# Patient Record
Sex: Female | Born: 1965
Health system: Southern US, Community
[De-identification: ages and names within clinical notes are randomized; demographics above are authoritative.]

## PROBLEM LIST (undated history)

## (undated) ENCOUNTER — Emergency Department (HOSPITAL_COMMUNITY): Admission: EM | Discharge status: Absent without leave | Payer: Self-pay

## (undated) DIAGNOSIS — G8929 Other chronic pain: Secondary | ICD-10-CM

## (undated) DIAGNOSIS — F32A Depression, unspecified: Secondary | ICD-10-CM

## (undated) DIAGNOSIS — F329 Major depressive disorder, single episode, unspecified: Secondary | ICD-10-CM

## (undated) DIAGNOSIS — I10 Essential (primary) hypertension: Secondary | ICD-10-CM

## (undated) DIAGNOSIS — R569 Unspecified convulsions: Secondary | ICD-10-CM

## (undated) DIAGNOSIS — R51 Headache: Secondary | ICD-10-CM

## (undated) DIAGNOSIS — R519 Headache, unspecified: Secondary | ICD-10-CM

## (undated) HISTORY — DX: Unspecified convulsions: R56.9

## (undated) HISTORY — DX: Depression, unspecified: F32.A

## (undated) HISTORY — DX: Other chronic pain: G89.29

## (undated) HISTORY — PX: TUBAL LIGATION: SHX77

## (undated) HISTORY — DX: Essential (primary) hypertension: I10

## (undated) HISTORY — DX: Major depressive disorder, single episode, unspecified: F32.9

## (undated) HISTORY — DX: Headache, unspecified: R51.9

## (undated) HISTORY — DX: Headache: R51

---

## 1998-07-13 ENCOUNTER — Emergency Department (HOSPITAL_COMMUNITY): Admission: EM | Admit: 1998-07-13 | Discharge: 1998-07-13 | Payer: Self-pay | Admitting: Emergency Medicine

## 1998-07-15 ENCOUNTER — Emergency Department (HOSPITAL_COMMUNITY): Admission: EM | Admit: 1998-07-15 | Discharge: 1998-07-15 | Payer: Self-pay | Admitting: Endocrinology

## 1998-07-17 ENCOUNTER — Emergency Department (HOSPITAL_COMMUNITY): Admission: EM | Admit: 1998-07-17 | Discharge: 1998-07-17 | Payer: Self-pay | Admitting: Emergency Medicine

## 2001-04-08 ENCOUNTER — Encounter: Payer: Self-pay | Admitting: *Deleted

## 2001-04-08 ENCOUNTER — Emergency Department (HOSPITAL_COMMUNITY): Admission: AC | Admit: 2001-04-08 | Discharge: 2001-04-08 | Payer: Self-pay

## 2001-04-08 ENCOUNTER — Encounter: Payer: Self-pay | Admitting: Emergency Medicine

## 2001-04-08 ENCOUNTER — Encounter: Payer: Self-pay | Admitting: Orthopedic Surgery

## 2001-04-21 ENCOUNTER — Encounter: Payer: Self-pay | Admitting: Orthopedic Surgery

## 2001-04-23 ENCOUNTER — Ambulatory Visit (HOSPITAL_COMMUNITY): Admission: RE | Admit: 2001-04-23 | Discharge: 2001-04-24 | Payer: Self-pay | Admitting: Orthopedic Surgery

## 2004-05-29 ENCOUNTER — Emergency Department (HOSPITAL_COMMUNITY): Admission: EM | Admit: 2004-05-29 | Discharge: 2004-05-29 | Payer: Self-pay | Admitting: Emergency Medicine

## 2004-07-04 ENCOUNTER — Emergency Department (HOSPITAL_COMMUNITY): Admission: EM | Admit: 2004-07-04 | Discharge: 2004-07-04 | Payer: Self-pay | Admitting: Emergency Medicine

## 2004-09-20 ENCOUNTER — Emergency Department (HOSPITAL_COMMUNITY): Admission: EM | Admit: 2004-09-20 | Discharge: 2004-09-20 | Payer: Self-pay | Admitting: Emergency Medicine

## 2004-12-11 ENCOUNTER — Emergency Department (HOSPITAL_COMMUNITY): Admission: EM | Admit: 2004-12-11 | Discharge: 2004-12-11 | Payer: Self-pay | Admitting: Emergency Medicine

## 2005-01-26 ENCOUNTER — Emergency Department (HOSPITAL_COMMUNITY): Admission: EM | Admit: 2005-01-26 | Discharge: 2005-01-26 | Payer: Self-pay | Admitting: Emergency Medicine

## 2005-02-28 ENCOUNTER — Ambulatory Visit: Payer: Self-pay | Admitting: Internal Medicine

## 2005-03-09 ENCOUNTER — Ambulatory Visit (HOSPITAL_COMMUNITY): Admission: RE | Admit: 2005-03-09 | Discharge: 2005-03-09 | Payer: Self-pay | Admitting: *Deleted

## 2005-06-24 ENCOUNTER — Emergency Department (HOSPITAL_COMMUNITY): Admission: EM | Admit: 2005-06-24 | Discharge: 2005-06-24 | Payer: Self-pay | Admitting: Emergency Medicine

## 2005-11-24 ENCOUNTER — Emergency Department (HOSPITAL_COMMUNITY): Admission: EM | Admit: 2005-11-24 | Discharge: 2005-11-24 | Payer: Self-pay | Admitting: Family Medicine

## 2005-11-27 ENCOUNTER — Encounter: Admission: RE | Admit: 2005-11-27 | Discharge: 2005-11-27 | Payer: Self-pay | Admitting: Occupational Medicine

## 2005-12-12 ENCOUNTER — Encounter: Admission: RE | Admit: 2005-12-12 | Discharge: 2006-03-12 | Payer: Self-pay | Admitting: Occupational Medicine

## 2006-01-31 ENCOUNTER — Encounter: Admission: RE | Admit: 2006-01-31 | Discharge: 2006-05-01 | Payer: Self-pay | Admitting: Anesthesiology

## 2006-02-04 ENCOUNTER — Ambulatory Visit: Payer: Self-pay | Admitting: Anesthesiology

## 2006-02-19 ENCOUNTER — Encounter
Admission: RE | Admit: 2006-02-19 | Discharge: 2006-05-20 | Payer: Self-pay | Admitting: Physical Medicine & Rehabilitation

## 2006-02-20 ENCOUNTER — Ambulatory Visit: Payer: Self-pay | Admitting: Physical Medicine & Rehabilitation

## 2006-03-26 ENCOUNTER — Encounter: Admission: RE | Admit: 2006-03-26 | Discharge: 2006-06-24 | Payer: Self-pay | Admitting: Occupational Medicine

## 2006-04-02 ENCOUNTER — Telehealth (INDEPENDENT_AMBULATORY_CARE_PROVIDER_SITE_OTHER): Payer: Self-pay | Admitting: *Deleted

## 2006-04-02 DIAGNOSIS — R569 Unspecified convulsions: Secondary | ICD-10-CM

## 2006-06-06 ENCOUNTER — Emergency Department (HOSPITAL_COMMUNITY): Admission: EM | Admit: 2006-06-06 | Discharge: 2006-06-06 | Payer: Self-pay | Admitting: Emergency Medicine

## 2006-10-07 ENCOUNTER — Emergency Department (HOSPITAL_COMMUNITY): Admission: EM | Admit: 2006-10-07 | Discharge: 2006-10-07 | Payer: Self-pay | Admitting: Emergency Medicine

## 2006-11-27 ENCOUNTER — Emergency Department (HOSPITAL_COMMUNITY): Admission: EM | Admit: 2006-11-27 | Discharge: 2006-11-27 | Payer: Self-pay | Admitting: Emergency Medicine

## 2007-01-27 ENCOUNTER — Emergency Department (HOSPITAL_COMMUNITY): Admission: EM | Admit: 2007-01-27 | Discharge: 2007-01-27 | Payer: Self-pay | Admitting: Emergency Medicine

## 2007-02-24 ENCOUNTER — Encounter (INDEPENDENT_AMBULATORY_CARE_PROVIDER_SITE_OTHER): Payer: Self-pay | Admitting: Nurse Practitioner

## 2007-02-24 ENCOUNTER — Ambulatory Visit: Payer: Self-pay | Admitting: Family Medicine

## 2007-02-24 LAB — CONVERTED CEMR LAB
ALT: 25 units/L (ref 0–35)
AST: 25 units/L (ref 0–37)
Alkaline Phosphatase: 95 units/L (ref 39–117)
Basophils Absolute: 0 10*3/uL (ref 0.0–0.1)
Eosinophils Absolute: 0 10*3/uL (ref 0.0–0.7)
Eosinophils Relative: 1 % (ref 0–5)
HCT: 40.6 % (ref 36.0–46.0)
LDL Cholesterol: 123 mg/dL — ABNORMAL HIGH (ref 0–99)
MCV: 95.8 fL (ref 78.0–100.0)
Neutrophils Relative %: 62 % (ref 43–77)
Phenytoin Lvl: 3.7 ug/mL — ABNORMAL LOW (ref 10.0–20.0)
Platelets: 375 10*3/uL (ref 150–400)
Potassium: 4.6 meq/L (ref 3.5–5.3)
RDW: 15.5 % (ref 11.5–15.5)
Sodium: 141 meq/L (ref 135–145)
TSH: 1.31 microintl units/mL (ref 0.350–5.50)
Total Bilirubin: 0.3 mg/dL (ref 0.3–1.2)
Total Protein: 8.1 g/dL (ref 6.0–8.3)
VLDL: 24 mg/dL (ref 0–40)

## 2007-07-27 ENCOUNTER — Ambulatory Visit: Payer: Self-pay | Admitting: *Deleted

## 2007-09-11 ENCOUNTER — Emergency Department (HOSPITAL_COMMUNITY): Admission: AC | Admit: 2007-09-11 | Discharge: 2007-09-11 | Payer: Self-pay

## 2007-11-04 ENCOUNTER — Ambulatory Visit: Payer: Self-pay | Admitting: Internal Medicine

## 2007-11-04 LAB — CONVERTED CEMR LAB: Phenytoin Lvl: <0.5 ug/mL — ABNORMAL LOW (ref 10.0–20.0)

## 2007-12-04 ENCOUNTER — Emergency Department (HOSPITAL_COMMUNITY): Admission: EM | Admit: 2007-12-04 | Discharge: 2007-12-04 | Payer: Self-pay | Admitting: Emergency Medicine

## 2007-12-20 ENCOUNTER — Emergency Department (HOSPITAL_COMMUNITY): Admission: EM | Admit: 2007-12-20 | Discharge: 2007-12-20 | Payer: Self-pay | Admitting: Emergency Medicine

## 2007-12-22 ENCOUNTER — Ambulatory Visit: Payer: Self-pay | Admitting: Family Medicine

## 2007-12-22 ENCOUNTER — Encounter (INDEPENDENT_AMBULATORY_CARE_PROVIDER_SITE_OTHER): Payer: Self-pay | Admitting: Family Medicine

## 2008-04-01 ENCOUNTER — Ambulatory Visit: Payer: Self-pay | Admitting: Family Medicine

## 2008-04-01 LAB — CONVERTED CEMR LAB
AST: 14 units/L (ref 0–37)
Albumin: 4.2 g/dL (ref 3.5–5.2)
Alkaline Phosphatase: 141 units/L — ABNORMAL HIGH (ref 39–117)
BUN: 7 mg/dL (ref 6–23)
Basophils Relative: 0 % (ref 0–1)
Creatinine, Ser: 0.66 mg/dL (ref 0.40–1.20)
Eosinophils Absolute: 0 10*3/uL (ref 0.0–0.7)
MCHC: 29 g/dL — ABNORMAL LOW (ref 30.0–36.0)
MCV: 90.7 fL (ref 78.0–100.0)
Monocytes Relative: 9 % (ref 3–12)
Neutrophils Relative %: 64 % (ref 43–77)
Platelets: 347 10*3/uL (ref 150–400)
Potassium: 4 meq/L (ref 3.5–5.3)
RBC: 3.96 M/uL (ref 3.87–5.11)
RDW: 16.6 % — ABNORMAL HIGH (ref 11.5–15.5)

## 2008-07-21 ENCOUNTER — Ambulatory Visit: Payer: Self-pay | Admitting: Internal Medicine

## 2008-08-25 ENCOUNTER — Ambulatory Visit: Payer: Self-pay | Admitting: Family Medicine

## 2008-11-29 ENCOUNTER — Ambulatory Visit: Payer: Self-pay | Admitting: Family Medicine

## 2008-11-29 LAB — CONVERTED CEMR LAB
ALT: 17 units/L (ref 0–35)
AST: 19 units/L (ref 0–37)
Bilirubin, Direct: 0.1 mg/dL (ref 0.0–0.3)
Indirect Bilirubin: 0.1 mg/dL (ref 0.0–0.9)

## 2008-12-23 ENCOUNTER — Ambulatory Visit: Payer: Self-pay | Admitting: Family Medicine

## 2008-12-23 LAB — CONVERTED CEMR LAB
Basophils Absolute: 0 10*3/uL (ref 0.0–0.1)
Basophils Relative: 1 % (ref 0–1)
Eosinophils Relative: 1 % (ref 0–5)
HCT: 39.2 % (ref 36.0–46.0)
Hemoglobin: 12.4 g/dL (ref 12.0–15.0)
MCHC: 31.6 g/dL (ref 30.0–36.0)
MCV: 94.9 fL (ref 78.0–100.0)
Monocytes Absolute: 0.4 10*3/uL (ref 0.1–1.0)
RDW: 14.2 % (ref 11.5–15.5)

## 2008-12-26 ENCOUNTER — Ambulatory Visit (HOSPITAL_COMMUNITY): Admission: RE | Admit: 2008-12-26 | Discharge: 2008-12-26 | Payer: Self-pay | Admitting: Family Medicine

## 2009-01-19 ENCOUNTER — Ambulatory Visit: Payer: Self-pay | Admitting: Internal Medicine

## 2009-01-31 ENCOUNTER — Ambulatory Visit (HOSPITAL_COMMUNITY): Admission: RE | Admit: 2009-01-31 | Discharge: 2009-01-31 | Payer: Self-pay | Admitting: Family Medicine

## 2009-03-24 ENCOUNTER — Ambulatory Visit: Payer: Self-pay | Admitting: Family Medicine

## 2009-08-07 ENCOUNTER — Ambulatory Visit: Payer: Self-pay | Admitting: Family Medicine

## 2009-08-10 ENCOUNTER — Ambulatory Visit: Payer: Self-pay | Admitting: Internal Medicine

## 2010-03-11 ENCOUNTER — Encounter: Payer: Self-pay | Admitting: *Deleted

## 2010-05-06 ENCOUNTER — Emergency Department (HOSPITAL_COMMUNITY): Payer: Worker's Compensation

## 2010-05-06 ENCOUNTER — Emergency Department (HOSPITAL_COMMUNITY)
Admission: EM | Admit: 2010-05-06 | Discharge: 2010-05-06 | Disposition: A | Discharge status: Home / Self Care | Payer: Worker's Compensation | Attending: Emergency Medicine | Admitting: Emergency Medicine

## 2010-05-06 DIAGNOSIS — G40909 Epilepsy, unspecified, not intractable, without status epilepticus: Secondary | ICD-10-CM | POA: Insufficient documentation

## 2010-05-06 DIAGNOSIS — X500XXA Overexertion from strenuous movement or load, initial encounter: Secondary | ICD-10-CM | POA: Insufficient documentation

## 2010-05-06 DIAGNOSIS — IMO0002 Reserved for concepts with insufficient information to code with codable children: Secondary | ICD-10-CM | POA: Insufficient documentation

## 2010-05-06 DIAGNOSIS — M25519 Pain in unspecified shoulder: Secondary | ICD-10-CM | POA: Insufficient documentation

## 2010-05-06 DIAGNOSIS — R209 Unspecified disturbances of skin sensation: Secondary | ICD-10-CM | POA: Insufficient documentation

## 2010-06-20 ENCOUNTER — Emergency Department (HOSPITAL_COMMUNITY)
Admission: EM | Admit: 2010-06-20 | Discharge: 2010-06-20 | Disposition: A | Discharge status: Home / Self Care | Payer: Worker's Compensation | Attending: Emergency Medicine | Admitting: Emergency Medicine

## 2010-06-20 DIAGNOSIS — R609 Edema, unspecified: Secondary | ICD-10-CM | POA: Insufficient documentation

## 2010-06-20 DIAGNOSIS — G40909 Epilepsy, unspecified, not intractable, without status epilepticus: Secondary | ICD-10-CM | POA: Insufficient documentation

## 2010-06-20 DIAGNOSIS — M62838 Other muscle spasm: Secondary | ICD-10-CM | POA: Insufficient documentation

## 2010-06-20 DIAGNOSIS — M79609 Pain in unspecified limb: Secondary | ICD-10-CM | POA: Insufficient documentation
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
## 2010-07-06 NOTE — Procedures (Signed)
NAME:  Kathleen Davis, WIGAL NO.:  [id number]   MEDICAL RECORD NO.:  [id number]          PATIENT TYPE:  REC   LOCATION:  [facility]                         FACILITY:  [facility]   PHYSICIAN:  Erick Colace, M.D.DATE OF BIRTH:  October 22, 1965   DATE OF PROCEDURE:  DATE OF DISCHARGE:                               OPERATIVE REPORT   PROCEDURE:  Right sacroiliac injection under fluoroscopic guidance.   INDICATIONS FOR PROCEDURE:  Sacroiliac disorder, only partially  responsive to medication management under conservative care.  She had a  left sacroiliac injection on February 20, 2006 and now has similar  symptoms on the right side.  She no longer has left-sided pain.   The pain is greater than 6/10.   Informed consent was obtained after describing risks and benefits to the  patient.  These include bleeding, bruising, infection, loss of bowel and  bladder function, temporary or permanent paralysis.  She would like to  proceed and has given written consent.   Patient placed prone on fluoroscopy table, Betadine prep and sterile  drape.  A 25-gauge, 1.5-inch needle was used to anesthetize the skin and  subcutaneous tissue with 1% Lidocaine times 2 cc.  Then a 22-gauge, 3.5-  inch spinal needle was inserted in the right sacroiliac joint under  fluoroscopic guidance.  AP and lateral imaging utilized.  Omnipaque 180  demonstrated no intravascular uptake in a solution containing .5cc of  40mg /cc Depo-Medrol and 1.5 cc of 2% lidocaine NPF.  The patient  tolerated the procedure well.  Pre and postinjection vitals stable.  Preinjection pain level 7/10 and postinjection pain level 0/10.   Patient will follow up with Dr. Alto Denver.  She will assess the further needs  for injection, if necessary.      Erick Colace, M.D.  Electronically Signed     AEK/MEDQ  D:  03/31/2006 11:35:54  T:  03/31/2006 13:03:24  Job:  [id number]   cc:   Zelphia [place]. Alto Denver, M.D.  Fax: [phone]

## 2010-07-06 NOTE — Procedures (Signed)
NAME:  [name], [name] NO.:  [id number]   MEDICAL RECORD NO.:  [id number]          PATIENT TYPE:  REC   LOCATION:  [facility]                          FACILITY:  [facility]   PHYSICIAN:  Celene Kras, MD        DATE OF BIRTH:  02-12-1966   DATE OF PROCEDURE:  02/04/2006  DATE OF DISCHARGE:                               OPERATIVE REPORT   Kathleen Davis comes to [facility] today through referral of  Kathleen Davis, M.D.  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  Since this time she has had escalated pain in the inferogluteal region,  paralumbar pain, decreased endurance and range of motion.  She has been  taking ibuprofen treated conservatively continues to breakthrough. No  advancing neurological features. Modest radicular component.  I reviewed  the MRI which reveals degenerative changes lower segments, no obvious  HNP bulging pattern, facetal overlay.  The pain is 4/10 subjective  scale, escalating with most activities. She is able to perform some of  her activities, difficulty with driving, sitting and moving.  Medications do help, as well as rest.  Medications attached to chart.  She states no specific allergy. 14 point review of systems and health  and history form reviewed.  She is a smoker I caution, she will see her  primary care regarding this.  This is contributory.   PAST MEDICAL HISTORY:  Otherwise essentially denies. States she is a  healthy, she had no particular surgeries.  Remotely she had a seizure,  but this has not been problematic and she is able to drive and the like.   FAMILY HISTORY:  Otherwise noncontributory.   REVIEW OF SYSTEMS/SOCIAL HISTORY:  As to has no otherwise noted and  noncontributory to the pain problem.   PHYSICAL EXAM:  GENERAL:  A pleasant, moderately obese female sitting  comfortably in bed.  Gay affect. Appearance is normal, oriented x3.  HEENT:   Unremarkable.  CHEST:  Clear to auscultation and percussion.  She has a regular rate  and rhythm without rub, murmur, or gallop.  ABDOMEN:  Obese, benign, no hepatosplenomegaly.  Diffuse paralumbar  myofascial discomfort, Wharton test positive with modified gaze using  Patrick's positive left greater than right.  EHL is fine.  Intact  neurologically. Straight leg lift modestly impaired left greater than  right. Side bending positive.  She has no other overt neurological  deficit, motor sensory reflexive.   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   PLAN:  1. It is reasonable to go on to SI joint injection inferior medial      margin and follow expectantly.  If she breaks through I am      wondering if a central compartment injection would be helpful or      possibly a transforaminal injection at 5.  There is a modest      component here.  I do not think the facets are  over playing this      much.  This will be diagnostic and therapeutic positive predictive      experience, at some point may lead [place] to consideration      radiofrequency neural ablation.  She is a forthright individual who      wants to continue to work and be as active as possible.  2. We are also going to give her some Wygesic for breakthrough, as she      is taking quite a bit of ibuprofen.  3. Maintain contact with Dr. Marny Lowenstein.  4. Consider TENS unit muscle stimulator.  5. Weight control and cigarette cessation for best outcome.   She is consented for today's procedure.   DESCRIPTION OF PROCEDURE:  The patient taken to fluoroscopy suite and  placed in the prone position.  Back prepped, draped usual fashion. Using  a 20-gauge spinal needle I advanced the inferior medial margin, left SI  and confirmed placement.  We then injected 3 mL lidocaine 1% MPF and 40  mg of Aristocort.   She tolerated the procedure well.  No complications from procedure.  She   states she was improved at discharge.  No barrier to communication.  Predicate further injection based on need and overall response.           ______________________________  Celene Kras, MD     HH/MEDQ  D:  02/04/2006 13:41:16  T:  02/05/2006 00:13:01  Job:  [id number]   cc:   Kathleen Davis, M.D.  Fax: [phone]
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
## 2010-07-06 NOTE — Procedures (Signed)
NAME:  Kathleen Davis, Kathleen Davis NO.:  [id number]   MEDICAL RECORD NO.:  [id number]          PATIENT TYPE:  REC   LOCATION:  [facility]                          FACILITY:  [facility]   PHYSICIAN:  Erick Colace, M.D.DATE OF BIRTH:  1966/01/18   DATE OF PROCEDURE:  02/20/2006  DATE OF DISCHARGE:                               OPERATIVE REPORT   PROCEDURE:  Left sacroiliac joint injection with fluoroscopic guidance.   INDICATIONS FOR PROCEDURE:  Left low back and buttock pain with good  relief with prior sacroiliac joint on 02/04/06 per Dr. Stevphen Rochester at this  office.  The patient still has some residual pain mainly with activity  where it goes up to 6 out of 10 level.  At rest it is 2 out of 10.   Informed consent was obtained after describing risks and benefits of the  procedure.  These including bleeding, bruising, infection and lower  extremity paralysis.  She elects to proceed and has given written  consent.  She has not taken any anticoagulant medications or antibiotics  at the current time.  She has no drug sensitivities or allergies to  lidocaine or to corticosteroids.   DESCRIPTION OF PROCEDURE:  The patient was placed prone on the  fluoroscopy table.  Betadine prep, sterile drape, 25 gauge 1 1/2 inch  needle was used to anesthetize the skin and subcu tissue with 1%  lidocaine 2 mL.  Then a 25 gauge 3 inch spinal needle was inserted into  the left sacroiliac joint under fluoroscopic guidance.  AP and lateral  imaging utilized.  Fluoroscopy demonstrated initially intravascular  uptake.  Needle was repositioned and did not demonstrate any  intravascular uptake and then a solution containing 1.5 mL of 2+  lidocaine plus 0.5 mL of 10 mg per mL Dexamethasone phosphate was  injected.  The patient tolerated the procedure well.  Pre and post  injection vital signs were stable.  She will follow-up with Dr. Kathrine Haddock.  If she needs another sacroiliac joint this can be done in  the future but I do not think any more are needed at this time.      Erick Colace, M.D.  Electronically Signed     AEK/MEDQ  D:  02/20/2006 13:17:10  T:  02/20/2006 13:41:23  Job:  [id number]
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
## 2010-07-27 ENCOUNTER — Other Ambulatory Visit (HOSPITAL_COMMUNITY): Payer: Self-pay | Admitting: Family Medicine

## 2010-07-27 DIAGNOSIS — M25512 Pain in left shoulder: Secondary | ICD-10-CM

## 2010-07-31 ENCOUNTER — Ambulatory Visit (HOSPITAL_COMMUNITY)
Admission: RE | Admit: 2010-07-31 | Discharge: 2010-07-31 | Disposition: A | Discharge status: Home / Self Care | Payer: Worker's Compensation | Source: Ambulatory Visit | Attending: Family Medicine | Admitting: Family Medicine

## 2010-07-31 DIAGNOSIS — M19019 Primary osteoarthritis, unspecified shoulder: Secondary | ICD-10-CM | POA: Insufficient documentation

## 2010-07-31 DIAGNOSIS — M25519 Pain in unspecified shoulder: Secondary | ICD-10-CM | POA: Insufficient documentation

## 2010-07-31 DIAGNOSIS — M25512 Pain in left shoulder: Secondary | ICD-10-CM

## 2010-10-30 ENCOUNTER — Ambulatory Visit: Payer: Worker's Compensation | Attending: Family Medicine | Admitting: Physical Therapy

## 2010-10-30 DIAGNOSIS — IMO0001 Reserved for inherently not codable concepts without codable children: Secondary | ICD-10-CM | POA: Insufficient documentation

## 2010-10-30 DIAGNOSIS — M25519 Pain in unspecified shoulder: Secondary | ICD-10-CM | POA: Insufficient documentation

## 2010-10-30 DIAGNOSIS — M25619 Stiffness of unspecified shoulder, not elsewhere classified: Secondary | ICD-10-CM | POA: Insufficient documentation

## 2010-11-06 ENCOUNTER — Ambulatory Visit: Payer: Worker's Compensation | Admitting: Physical Therapy

## 2010-11-08 ENCOUNTER — Ambulatory Visit: Payer: Worker's Compensation | Admitting: Physical Therapy

## 2010-11-15 ENCOUNTER — Ambulatory Visit: Payer: Worker's Compensation | Admitting: Physical Therapy

## 2010-11-16 ENCOUNTER — Ambulatory Visit: Payer: Worker's Compensation | Admitting: Physical Therapy

## 2010-11-16 LAB — CBC
HCT: 33.5 — ABNORMAL LOW
Hemoglobin: 10.7 — ABNORMAL LOW
MCHC: 31.9
RDW: 16.6 — ABNORMAL HIGH

## 2010-11-16 LAB — PHENYTOIN LEVEL, FREE AND TOTAL
Phenytoin Bound: 1.5
Phenytoin, Total: 1.7 — ABNORMAL LOW (ref 10.0–20.0)

## 2010-11-16 LAB — POCT I-STAT, CHEM 8
BUN: 7
Calcium, Ion: 1.12
Chloride: 108
Glucose, Bld: 132 — ABNORMAL HIGH
Potassium: 3.3 — ABNORMAL LOW

## 2010-11-16 LAB — ALBUMIN: Albumin: 3.9

## 2010-11-16 LAB — PROTIME-INR: INR: 1

## 2010-11-19 LAB — PHENYTOIN LEVEL, TOTAL: Phenytoin Lvl: <2.5 — ABNORMAL LOW

## 2010-11-20 ENCOUNTER — Ambulatory Visit: Payer: Worker's Compensation | Attending: Family Medicine | Admitting: Physical Therapy

## 2010-11-20 DIAGNOSIS — M25619 Stiffness of unspecified shoulder, not elsewhere classified: Secondary | ICD-10-CM | POA: Insufficient documentation

## 2010-11-20 DIAGNOSIS — M25519 Pain in unspecified shoulder: Secondary | ICD-10-CM | POA: Insufficient documentation

## 2010-11-20 DIAGNOSIS — IMO0001 Reserved for inherently not codable concepts without codable children: Secondary | ICD-10-CM | POA: Insufficient documentation

## 2010-11-21 ENCOUNTER — Ambulatory Visit: Payer: Worker's Compensation | Admitting: Physical Therapy

## 2010-11-22 ENCOUNTER — Ambulatory Visit: Payer: Worker's Compensation | Admitting: Physical Therapy

## 2010-11-28 ENCOUNTER — Ambulatory Visit: Payer: Worker's Compensation | Admitting: Physical Therapy

## 2010-11-29 ENCOUNTER — Ambulatory Visit: Payer: Worker's Compensation | Admitting: Physical Therapy

## 2010-11-29 LAB — BASIC METABOLIC PANEL
BUN: 4 — ABNORMAL LOW
Calcium: 9.2
GFR calc non Af Amer: >60
Potassium: 4

## 2010-11-30 LAB — BASIC METABOLIC PANEL
BUN: 3 — ABNORMAL LOW
Calcium: 8.8
Chloride: 109
Creatinine, Ser: 0.62
GFR calc Af Amer: >60
GFR calc non Af Amer: >60

## 2010-12-03 ENCOUNTER — Ambulatory Visit: Payer: Worker's Compensation | Admitting: Physical Therapy

## 2010-12-06 ENCOUNTER — Ambulatory Visit: Payer: Worker's Compensation | Admitting: Physical Therapy

## 2011-06-20 ENCOUNTER — Other Ambulatory Visit (HOSPITAL_COMMUNITY): Payer: Self-pay | Admitting: Family Medicine

## 2011-06-20 DIAGNOSIS — R0989 Other specified symptoms and signs involving the circulatory and respiratory systems: Secondary | ICD-10-CM

## 2011-06-24 ENCOUNTER — Ambulatory Visit (HOSPITAL_COMMUNITY)
Admission: RE | Admit: 2011-06-24 | Discharge: 2011-06-24 | Disposition: A | Discharge status: Home / Self Care | Payer: Medicaid Other | Source: Ambulatory Visit | Attending: Family Medicine | Admitting: Family Medicine

## 2011-06-24 DIAGNOSIS — M5137 Other intervertebral disc degeneration, lumbosacral region: Secondary | ICD-10-CM | POA: Insufficient documentation

## 2011-06-24 DIAGNOSIS — M51379 Other intervertebral disc degeneration, lumbosacral region without mention of lumbar back pain or lower extremity pain: Secondary | ICD-10-CM | POA: Insufficient documentation

## 2011-06-24 DIAGNOSIS — R0989 Other specified symptoms and signs involving the circulatory and respiratory systems: Secondary | ICD-10-CM | POA: Insufficient documentation

## 2011-06-24 DIAGNOSIS — G8929 Other chronic pain: Secondary | ICD-10-CM | POA: Insufficient documentation

## 2011-06-24 DIAGNOSIS — R109 Unspecified abdominal pain: Secondary | ICD-10-CM | POA: Insufficient documentation

## 2011-09-19 DIAGNOSIS — G43909 Migraine, unspecified, not intractable, without status migrainosus: Secondary | ICD-10-CM

## 2011-09-19 HISTORY — DX: Migraine, unspecified, not intractable, without status migrainosus: G43.909

## 2016-05-14 ENCOUNTER — Encounter: Payer: Self-pay | Admitting: Emergency Medicine

## 2016-05-14 ENCOUNTER — Emergency Department (HOSPITAL_COMMUNITY)
Admission: EM | Admit: 2016-05-14 | Discharge: 2016-05-14 | Disposition: A | Discharge status: Home / Self Care | Payer: Medicaid Other | Attending: Emergency Medicine | Admitting: Emergency Medicine

## 2016-05-14 DIAGNOSIS — G40909 Epilepsy, unspecified, not intractable, without status epilepticus: Secondary | ICD-10-CM | POA: Insufficient documentation

## 2016-05-14 DIAGNOSIS — R569 Unspecified convulsions: Secondary | ICD-10-CM

## 2016-05-14 MED ORDER — ONDANSETRON HCL 4 MG PO TABS: 4.0000 mg | ORAL_TABLET | Freq: Three times a day (TID) | ORAL | 0 refills | Status: DC | PRN

## 2016-05-14 MED ORDER — LEVETIRACETAM 1000 MG PO TABS: 1000.0000 mg | ORAL_TABLET | Freq: Three times a day (TID) | ORAL | 0 refills | Status: DC

## 2016-05-14 MED ORDER — LEVETIRACETAM 500 MG PO TABS
1000.0000 mg | ORAL_TABLET | Freq: Once | ORAL | Status: AC
  Administered 2016-05-14: 1000 mg via ORAL
  Filled 2016-05-14: qty 2

## 2016-05-14 NOTE — ED Triage Notes (Signed)
Pt states she recently moved back to the area, has an appt on 4/18 but is out of her Keppra. Pt states she last had her meds last night. She last had a seizure on Sunday which is normal to have seizures. Pt does reports she has headaches.

## 2016-05-14 NOTE — ED Provider Notes (Signed)
[facility]-EMERGENCY DEPT Provider Note   CSN: [id number] Arrival date & time: 05/14/16  1109  By signing my name below, I, Marnette Burgess Long, attest that this documentation has been prepared under the direction and in the presence of Heide Scales, MD. Electronically Signed: Marnette Burgess Long, Scribe. 05/14/2016. 12:25 PM.  History   Chief Complaint Chief Complaint  Patient presents with  . Seizures   The history is provided by the patient and medical records. No language interpreter was used.  Seizures   This is a chronic problem. The current episode started 6 to 12 hours ago. The problem has been resolved. There was 1 seizure. The most recent episode lasted more than 5 minutes. Associated symptoms include headaches and nausea. Pertinent negatives include no confusion, no visual disturbance, no chest pain, no cough, no vomiting and no diarrhea. Characteristics include rhythmic jerking and loss of consciousness. The episode was witnessed. The seizures did not continue in the ED. Possible causes include medication or dosage change and sleep deprivation.    HPI Comments:  Kathleen Davis is a 51 y.o. female with a PMHx of Seizure Disorder, who presents to the Emergency Department seeking a refill of her 1g Keppra medication. Pt reports  having a grand mal seizure this morning lasting 13 minutes and three days ago. She states she normally has two seizures a week at her baseline which is improved from four seizures as day several years ago.  She notes she has just moved back to [place] and has an appointment to reestablish with her PCP on 06/05/16. She notes running out of her TID Keppra medication last night after she tried to ration the Keppra, taking one a day, for the last week. Pt has associated HA s/p the seizure that feels like past HA's, nausea from the medication, and urinary frequency. She additionally reports reduced PO intake as of late with poor sleep since moving back from [place] after  burying her mother. Bright lights exacerbate her HA. She did not take anything PTA for relief of her symptoms. Pt denies constipation, diarrhea, dysuria, hematuria, cough, congestion, abdominal pain, neck pain, and any other complaints at this time. Headache and symptoms are typical and are not abnormal.    No past medical history on file.  Patient Active Problem List   Diagnosis Date Noted  . SEIZURE DISORDER 04/02/2006   No past surgical history on file.  OB History    No data available      Home Medications    Prior to Admission medications   Not on File    Family History No family history on file.  Social History Social History  Substance Use Topics  . Smoking status: Not on file  . Smokeless tobacco: Not on file  . Alcohol use Not on file     Allergies   Patient has no known allergies.   Review of Systems Review of Systems  Constitutional: Negative for activity change, chills, diaphoresis, fatigue and fever.  HENT: Negative for congestion and rhinorrhea.   Eyes: Positive for photophobia. Negative for visual disturbance.  Respiratory: Negative for cough, chest tightness, shortness of breath and stridor.   Cardiovascular: Negative for chest pain, palpitations and leg swelling.  Gastrointestinal: Positive for nausea. Negative for abdominal distention, abdominal pain, constipation, diarrhea and vomiting.  Genitourinary: Positive for frequency. Negative for difficulty urinating, dysuria, flank pain, hematuria, menstrual problem, pelvic pain, vaginal bleeding and vaginal discharge.  Musculoskeletal: Negative for back pain and neck pain.  Skin:  Negative for rash and wound.  Neurological: Positive for seizures, loss of consciousness and headaches. Negative for dizziness, weakness, light-headedness and numbness.  Psychiatric/Behavioral: Negative for agitation and confusion.  All other systems reviewed and are negative.    Physical Exam Updated Vital Signs BP (!)  131/92 (BP Location: Right Arm)   Pulse 93   Temp 98.7 F (37.1 C) (Oral)   Resp 18   Ht 5\' 4"  (1.626 m)   Wt 181 lb (82.1 kg)   LMP 06/13/2011   SpO2 99%   BMI 31.07 kg/m   Physical Exam  Constitutional: She is oriented to person, place, and time. She appears well-developed and well-nourished. No distress.  HENT:  Head: Normocephalic and atraumatic.  Right Ear: External ear normal.  Left Ear: External ear normal.  Nose: Nose normal.  Mouth/Throat: Oropharynx is clear and moist. No oropharyngeal exudate.  Eyes: Conjunctivae and EOM are normal. Pupils are equal, round, and reactive to light.  Neck: Normal range of motion. Neck supple.  Cardiovascular: Normal rate, regular rhythm and intact distal pulses.   No murmur heard. Pulmonary/Chest: Effort normal and breath sounds normal. No stridor. No respiratory distress. She has no wheezes. She exhibits no tenderness.  Abdominal: Soft. She exhibits no distension. There is no tenderness. There is no rebound.  Musculoskeletal: She exhibits no tenderness.  Neurological: She is alert and oriented to person, place, and time. She has normal strength and normal reflexes. She displays normal reflexes. No cranial nerve deficit or sensory deficit. She exhibits normal muscle tone. Coordination and gait normal.  Skin: Skin is warm. Capillary refill takes less than 2 seconds. No rash noted. She is not diaphoretic. No erythema.  Psychiatric: She has a normal mood and affect.  Nursing note and vitals reviewed.    ED Treatments / Results  DIAGNOSTIC STUDIES:  Oxygen Saturation is 99% on RA, normal by my interpretation.    COORDINATION OF CARE:  12:21 PM Discussed treatment plan with pt at bedside including Keppra Refill and pt agreed to plan. Pt reports she does not drive and her son will be taking her home from the ED.  Labs (all labs ordered are listed, but only abnormal results are displayed) Labs Reviewed  CBG MONITORING, ED    EKG   EKG Interpretation None       Radiology No results found.  Procedures Procedures (including critical care time)  Medications Ordered in ED Medications  levETIRAcetam (KEPPRA) tablet 1,000 mg (1,000 mg Oral Given 05/14/16 1253)     Initial Impression / Assessment and Plan / ED Course  I have reviewed the triage vital signs and the nursing notes.  Pertinent labs & imaging results that were available during my care of the patient were reviewed by me and considered in my medical decision making (see chart for details).     Kathleen Davis is a 51 y.o. female with a PMHx of Seizure Disorder, who presents to the Emergency Department for seizure and refill of her Keppra.   History and exam are seen above.   On exam, patient has completely unremarkable exam. Normal strength, sensation, coordination, gait, clear lungs, nontender chest, and nontender abdomen. Patient has photophobia which she reports is consistent with prior headaches. No evidence of traumatic injury or other abnormalities on exam.   Patient does say that she has had some chronic urinary frequency however, today she does not want to stay to have blood work, urinalysis, or any imaging to further workup her seizure. She feels  the seizure was secondary to not taking enough of her medications. She reports that she has been rationing it recently because she did not have enough to get to her upcoming neurology appointment.  Shared decision-making conversation held with patient that infections such as urinary tract infections can cause seizures. Patient wants to wait and have this done at her follow-up appointment. Patient says her seizure was typical of her prior and she had no new neck stiffness, neck pain, or change in her chronic headaches.  Patient given a dose of the Keppra in the ED. Patient will be given prescription for her previously prescribed regimen to get her to her upcoming appointment. He should also given prescription  for nausea medication as she says her appetite has been decreased due to intermittent nausea. Patient denies nausea currently. Patient given Zofran prescription.  Patient given extremely strict return precautions for any changes or development of any new symptoms. A shunt understood plan for follow-up and return. Patient had no other questions or concerns and patient was discharged in good condition.    Final Clinical Impressions(s) / ED Diagnoses   Final diagnoses:  Seizure (HCC)    New Prescriptions New Prescriptions   LEVETIRACETAM (KEPPRA) 1000 MG TABLET    Take 1 tablet (1,000 mg total) by mouth 3 (three) times daily.   ONDANSETRON (ZOFRAN) 4 MG TABLET    Take 1 tablet (4 mg total) by mouth every 8 (eight) hours as needed for nausea or vomiting.   I personally performed the services described in this documentation, which was scribed in my presence. The recorded information has been reviewed and is accurate.  Clinical Impression: 1. Seizure [facility](HCC)     Disposition: Discharge  Condition: Good  I have discussed the results, Dx and Tx plan with the pt(& family if present). He/she/they expressed understanding and agree(s) with the plan. Discharge instructions discussed at great length. Strict return precautions discussed and pt &/or family have verbalized understanding of the instructions. No further questions at time of discharge.    New Prescriptions   LEVETIRACETAM (KEPPRA) 1000 MG TABLET    Take 1 tablet (1,000 mg total) by mouth 3 (three) times daily.   ONDANSETRON (ZOFRAN) 4 MG TABLET    Take 1 tablet (4 mg total) by mouth every 8 (eight) hours as needed for nausea or vomiting.    [facility] [address] [place] [id number] [phone]    [facility] [address] [id number] [place] [id number] [phone]  If symptoms worsen        Heide Scaleshristopher J Reilley Latorre, MD 05/14/16 1258

## 2016-05-14 NOTE — Discharge Instructions (Signed)
Take your Keppra as previously prescribed 3 times a day.  Please take your nausea medicine so that you can stay hydrated and not have nausea. Please go to your neurology appointment to get [facility]. Our shared decision making discussion today let us to focus on giving you the refilled prescription to treat your typical frequent seizures instead of workup with imaging or lab testing your seizure today. If anything changes or is different from your prior seizures, please return to the nearest ED.

## 2016-05-14 NOTE — ED Notes (Signed)
Patient states she is here for a refill on her seizure medication. States she has taken in 2 days because she is waiting for eligibility  States she has an appointment 4/18. States she had a seizure this am.

## 2016-07-16 ENCOUNTER — Encounter: Payer: Self-pay | Admitting: Pediatric Intensive Care

## 2016-07-19 ENCOUNTER — Encounter: Payer: Self-pay | Admitting: Pediatric Intensive Care

## 2016-07-19 ENCOUNTER — Encounter: Payer: Self-pay | Admitting: Critical Care Medicine

## 2016-07-19 MED FILL — lamoTRIgine 25 MG TABS: 25 | 30 days supply | Qty: 120 | Fill #0

## 2016-07-19 MED FILL — levETIRAcetam 1000 MG TABS: 1000 | 30 days supply | Qty: 90 | Fill #0

## 2016-07-23 ENCOUNTER — Encounter: Payer: Self-pay | Admitting: Pediatric Intensive Care

## 2016-07-23 NOTE — Congregational Nurse Program (Signed)
Congregational Nurse Program Note  Date of Encounter: 07/16/2016  Past Medical History: No past medical history on file.  Encounter Details:     CNP Questionnaire - 07/16/16 0900      Patient Demographics   Is this a new or existing patient? New   Patient is considered a/an Not Applicable   Race African-American/Black     Patient Assistance   Location of Patient Assistance [facility]   Patient's financial/insurance status Self-Pay (Uninsured);Low Income   Uninsured Patient ([employer] Card/Care Connects) Yes   Interventions Follow-up/Education/Support provided after completed appt.   Patient referred to apply for the following financial assistance Not Applicable   Food insecurities addressed Not Applicable   Transportation assistance No   Assistance securing medications No   Educational health offerings Navigating the healthcare system;Medications     Encounter Details   Primary purpose of visit Education/Health Concerns;Navigating the Healthcare System   Was an Emergency Department visit averted? Not Applicable   Does patient have a medical provider? Yes   Patient referred to Establish PCP   Was a mental health screening completed? (GAINS tool) No   Does patient have dental issues? No   Does patient have vision issues? No   Does your patient have an abnormal blood pressure today? No   Since previous encounter, have you referred patient for abnormal blood pressure that resulted in a new diagnosis or medication change? No   Does your patient have an abnormal blood glucose today? No   Since previous encounter, have you referred patient for abnormal blood glucose that resulted in a new diagnosis or medication change? No   Was there a life-saving intervention made? No     New client-states that she has a history of seizure disorder and [facility]. States that she takes keppra and "another seizure medication". Denies history of hypertension, diabetes. States that she  has filed for SSI/Meidcaid and is pending. States that her family helps her with medications. Declined BP check. [facility] Will follow up with CN as needed.

## 2016-07-23 NOTE — Congregational Nurse Program (Signed)
Congregational Nurse Program Note  Date of Encounter: 07/19/2016  Past Medical History: No past medical history on file.  Encounter Details:     CNP Questionnaire - 07/19/16 0845      Patient Demographics   Is this a new or existing patient? Existing   Patient is considered a/an Not Applicable   Race African-American/Black     Patient Assistance   Location of Patient Assistance [facility]   Patient's financial/insurance status Self-Pay (Uninsured);Low Income   Uninsured Patient ([employer] Research officer, trade unionCard/Care Connects) Yes   Interventions Not Applicable   Patient referred to apply for the following financial assistance Not Applicable   Food insecurities addressed Not Applicable   Transportation assistance No   Assistance securing medications Yes   Type of Assistance [facility]   Educational health offerings Navigating the healthcare system;Medications     Encounter Details   Primary purpose of visit Navigating the Healthcare System;Chronic Illness/Condition Visit   Was an Emergency Department visit averted? Not Applicable   Does patient have a medical provider? Yes   Patient referred to Establish PCP   Was a mental health screening completed? (GAINS tool) No   Does patient have dental issues? No   Does patient have vision issues? No   Does your patient have an abnormal blood pressure today? No   Since previous encounter, have you referred patient for abnormal blood pressure that resulted in a new diagnosis or medication change? No   Does your patient have an abnormal blood glucose today? No   Since previous encounter, have you referred patient for abnormal blood glucose that resulted in a new diagnosis or medication change? No   Was there a life-saving intervention made? No     Client declines BP check. States that she has not been able to get her seizure medication. CN [facility]. Dr Delford FieldWright to [facility] and CN  will pick up.

## 2016-07-23 NOTE — Congregational Nurse Program (Signed)
Congregational Nurse Program Note  Date of Encounter: 07/23/2016  Past Medical History: No past medical history on file.  Encounter Details:     CNP Questionnaire - 07/23/16 0930      Patient Demographics   Is this a new or existing patient? Existing   Patient is considered a/an Not Applicable   Race African-American/Black     Patient Assistance   Location of Patient Assistance [facility]   Patient's financial/insurance status Self-Pay (Uninsured);Low Income   Uninsured Patient ([employer] Card/Care Connects) Yes   Interventions Assisted patient in making appt.   Patient referred to apply for the following financial assistance Not Applicable   Food insecurities addressed Not Applicable   Transportation assistance Yes   Type of Assistance Bus Pass Given   Assistance securing medications No   Type of Assistance Other   Educational health offerings Acute disease;Navigating the healthcare system     Encounter Details   Primary purpose of visit Acute Illness/Condition Visit;Navigating the Healthcare System   Was an Emergency Department visit averted? Yes   Does patient have a medical provider? Yes   Patient referred to Clinic;Establish PCP   Was a mental health screening completed? (GAINS tool) No   Does patient have dental issues? No   Does patient have vision issues? No   Does your patient have an abnormal blood pressure today? Yes   Since previous encounter, have you referred patient for abnormal blood pressure that resulted in a new diagnosis or medication change? No   Does your patient have an abnormal blood glucose today? No   Since previous encounter, have you referred patient for abnormal blood glucose that resulted in a new diagnosis or medication change? No   Was there a life-saving intervention made? No     Client in for BP check. States her blood pressure was elevated at neurology appointment. Client has 4+pitting edema to knees bilaterally. Advised client to elevate,  decrease salt intake and increase water intake. [facility] for client. [facility], instructions for intake and bus passes. [facility] after [facility] and follow up with CN on Friday.

## 2016-07-26 ENCOUNTER — Encounter: Payer: Self-pay | Admitting: Pediatric Intensive Care

## 2016-08-05 ENCOUNTER — Encounter: Payer: Self-pay | Admitting: Pediatric Intensive Care

## 2016-08-09 ENCOUNTER — Encounter: Payer: Self-pay | Admitting: Pediatric Intensive Care

## 2016-08-15 NOTE — Congregational Nurse Program (Signed)
Congregational Nurse Program Note  Date of Encounter: 08/05/2016  Past Medical History: No past medical history on file.  Encounter Details:     CNP Questionnaire - 08/05/16 1820      Patient Demographics   Is this a new or existing patient? Existing   Patient is considered a/an Not Applicable   Race African-American/Black     Patient Assistance   Location of Patient Assistance Not Applicable   Patient's financial/insurance status Low Income;[employer] Card/Care Connects   Uninsured Patient ([employer] Research officer, trade unionCard/Care Connects) Yes   Interventions Not Applicable   Patient referred to apply for the following financial assistance Not Applicable   Food insecurities addressed Not Applicable   Transportation assistance No   Type of Assistance Bus Pass Given   Assistance securing medications No   Type of Assistance Other   Educational health offerings Interpersonal relationships     Encounter Details   Primary purpose of visit Chronic Illness/Condition Visit;Other   Was an Emergency Department visit averted? Not Applicable   Does patient have a medical provider? Yes   Patient referred to Not Applicable   Was a mental health screening completed? (GAINS tool) No   Does patient have dental issues? No   Does patient have vision issues? No   Does your patient have an abnormal blood pressure today? No   Since previous encounter, have you referred patient for abnormal blood pressure that resulted in a new diagnosis or medication change? No   Does your patient have an abnormal blood glucose today? No   Since previous encounter, have you referred patient for abnormal blood glucose that resulted in a new diagnosis or medication change? No   Was there a life-saving intervention made? No     Client reports "staff will not honor the note" that was provided by me.  Instructed client that she would need to talk with the director.

## 2016-08-15 NOTE — Congregational Nurse Program (Signed)
Congregational Nurse Program Note  Date of Encounter: 07/29/2016  Past Medical History: No past medical history on file.  Encounter Details:     CNP Questionnaire - 07/29/16 1816      Patient Demographics   Is this a new or existing patient? New   Patient is considered a/an Not Applicable   Race African-American/Black     Patient Assistance   Location of Patient Assistance Not Applicable   Patient's financial/insurance status Low Income;[employer] Card/Care Connects   Uninsured Patient ([employer] Research officer, trade unionCard/Care Connects) Yes   Interventions Not Applicable   Patient referred to apply for the following financial assistance Not Applicable   Food insecurities addressed Not Applicable   Transportation assistance No   Assistance securing medications No     Encounter Details   Primary purpose of visit Chronic Illness/Condition Visit;Other   Was an Emergency Department visit averted? Not Applicable   Does patient have a medical provider? Yes   Patient referred to Not Applicable   Was a mental health screening completed? (GAINS tool) No   Does patient have dental issues? No   Does patient have vision issues? No   Does your patient have an abnormal blood pressure today? No   Since previous encounter, have you referred patient for abnormal blood pressure that resulted in a new diagnosis or medication change? No   Does your patient have an abnormal blood glucose today? No   Since previous encounter, have you referred patient for abnormal blood glucose that resulted in a new diagnosis or medication change? No   Was there a life-saving intervention made? No     States has a seizure and requesting a note stating she could remain in the lobby during the hot hours of the day.  Note provided

## 2016-08-19 ENCOUNTER — Encounter: Payer: Self-pay | Admitting: Pediatric Intensive Care

## 2016-08-26 NOTE — Congregational Nurse Program (Signed)
Congregational Nurse Program Note  Date of Encounter: 08/05/2016  Past Medical History: No past medical history on file.  Encounter Details:     CNP Questionnaire - 08/05/16 1820      Patient Demographics   Is this a new or existing patient? Existing   Patient is considered a/an Not Applicable   Race African-American/Black     Patient Assistance   Location of Patient Assistance Not Applicable   Patient's financial/insurance status Low Income;[employer] Card/Care Connects   Uninsured Patient ([employer] Research officer, trade unionCard/Care Connects) Yes   Interventions Not Applicable   Patient referred to apply for the following financial assistance Not Applicable   Food insecurities addressed Not Applicable   Transportation assistance No   Type of Assistance Bus Pass Given   Assistance securing medications No   Type of Assistance Other   Educational health offerings Interpersonal relationships     Encounter Details   Primary purpose of visit Chronic Illness/Condition Visit;Other   Was an Emergency Department visit averted? Not Applicable   Does patient have a medical provider? Yes   Patient referred to Not Applicable   Was a mental health screening completed? (GAINS tool) No   Does patient have dental issues? No   Does patient have vision issues? No   Does your patient have an abnormal blood pressure today? No   Since previous encounter, have you referred patient for abnormal blood pressure that resulted in a new diagnosis or medication change? No   Does your patient have an abnormal blood glucose today? No   Since previous encounter, have you referred patient for abnormal blood glucose that resulted in a new diagnosis or medication change? No   Was there a life-saving intervention made? No     Lower extremity edema improved. Client has 4 more days of lasix prescription and is taking medication as directed.CN will [facility] and [facility]  application.

## 2016-08-26 NOTE — Congregational Nurse Program (Signed)
Congregational Nurse Program Note  Date of Encounter: 08/09/2016  Past Medical History: No past medical history on file.  Encounter Details:     CNP Questionnaire - 08/09/16 1030      Patient Demographics   Is this a new or existing patient? Existing   Patient is considered a/an Not Applicable   Race African-American/Black     Patient Assistance   Location of Patient Assistance [facility]   Patient's financial/insurance status Self-Pay (Uninsured);Low Income   Uninsured Patient ([place] Card/Care Connects) Yes   Interventions Not Applicable   Patient referred to apply for the following financial assistance Not Applicable   Food insecurities addressed Not Applicable   Transportation assistance No   Type of Assistance Other   Assistance securing medications Yes   Type of Holiday representativeAssistance [facility]   Educational health offerings Navigating the healthcare system     Encounter Details   Primary purpose of visit Navigating the Healthcare System;Post PCP Visit   Was an Emergency Department visit averted? Not Applicable   Does patient have a medical provider? Yes   Patient referred to Follow up with established PCP   Was a mental health screening completed? (GAINS tool) No   Does patient have dental issues? No   Does patient have vision issues? No   Does your patient have an abnormal blood pressure today? No   Since previous encounter, have you referred patient for abnormal blood pressure that resulted in a new diagnosis or medication change? No   Does your patient have an abnormal blood glucose today? No   Since previous encounter, have you referred patient for abnormal blood glucose that resulted in a new diagnosis or medication change? No   Was there a life-saving intervention made? No     [facility].

## 2016-08-26 NOTE — Congregational Nurse Program (Signed)
Congregational Nurse Program Note  Date of Encounter: 07/26/2016  Past Medical History: No past medical history on file.  Encounter Details:     CNP Questionnaire - 08/05/16 1820      Patient Demographics   Is this a new or existing patient? Existing   Patient is considered a/an Not Applicable   Race African-American/Black     Patient Assistance   Location of Patient Assistance Not Applicable   Patient's financial/insurance status Low Income;[facility] Card/Care Connects   Uninsured Patient ([employer] Research officer, trade unionCard/Care Connects) Yes   Interventions Not Applicable   Patient referred to apply for the following financial assistance Not Applicable   Food insecurities addressed Not Applicable   Transportation assistance No   Type of Assistance Bus Pass Given   Assistance securing medications No   Type of Assistance Other   Educational health offerings Interpersonal relationships     Encounter Details   Primary purpose of visit Chronic Illness/Condition Visit;Other   Was an Emergency Department visit averted? Not Applicable   Does patient have a medical provider? Yes   Patient referred to Not Applicable   Was a mental health screening completed? (GAINS tool) No   Does patient have dental issues? No   Does patient have vision issues? No   Does your patient have an abnormal blood pressure today? No   Since previous encounter, have you referred patient for abnormal blood pressure that resulted in a new diagnosis or medication change? No   Does your patient have an abnormal blood glucose today? No   Since previous encounter, have you referred patient for abnormal blood glucose that resulted in a new diagnosis or medication change? No   Was there a life-saving intervention made? No     BP check. Edema is improved. CN will assist client with source for keppra prescription.

## 2016-08-30 ENCOUNTER — Encounter: Payer: Self-pay | Admitting: Pediatric Intensive Care

## 2016-09-10 NOTE — Congregational Nurse Program (Signed)
Congregational Nurse Program Note  Date of Encounter: 08/19/2016  Past Medical History: No past medical history on file.  Encounter Details:     CNP Questionnaire - 08/19/16 0900      Patient Demographics   Is this a new or existing patient? Existing   Patient is considered a/an Not Applicable   Race African-American/Black     Patient Assistance   Location of Patient Assistance [facility]   Patient's financial/insurance status Self-Pay (Uninsured);Low Income   Uninsured Patient ([employer] Research officer, trade unionCard/Care Connects) Yes   Interventions Not Applicable   Patient referred to apply for the following financial assistance Not Applicable   Food insecurities addressed Not Applicable   Transportation assistance No   Type of Assistance Other   Assistance securing medications No   Type of Assistance Other   Educational health offerings Chronic disease     Encounter Details   Primary purpose of visit Chronic Illness/Condition Visit   Was an Emergency Department visit averted? Not Applicable   Does patient have a medical provider? Yes   Patient referred to Follow up with established PCP   Was a mental health screening completed? (GAINS tool) No   Does patient have dental issues? No   Does patient have vision issues? No   Does your patient have an abnormal blood pressure today? No   Since previous encounter, have you referred patient for abnormal blood pressure that resulted in a new diagnosis or medication change? No   Does your patient have an abnormal blood glucose today? No   Since previous encounter, have you referred patient for abnormal blood glucose that resulted in a new diagnosis or medication change? No   Was there a life-saving intervention made? No     BP check. Client has finished lasix course. Swelling returned to both extremities after this. Client has pitting edema both legs. CN will try to obtain compression stockings for client. [facility]

## 2016-09-20 ENCOUNTER — Encounter: Payer: Self-pay | Admitting: Pediatric Intensive Care

## 2016-09-26 NOTE — Congregational Nurse Program (Signed)
Congregational Nurse Program Note  Date of Encounter: 08/30/2016  Past Medical History: No past medical history on file.  Encounter Details:     CNP Questionnaire - 08/30/16 0900      Patient Demographics   Is this a new or existing patient? Existing   Patient is considered a/an Not Applicable   Race African-American/Black     Patient Assistance   Location of Patient Assistance [facility]   Patient's financial/insurance status Medicaid;Low Income   Uninsured Patient ([employer] Research officer, trade unionCard/Care Connects) No   Interventions Not Applicable   Patient referred to apply for the following financial assistance Not Applicable   Food insecurities addressed Not Applicable   Transportation assistance No   Type of Assistance Other   Assistance securing medications No   Type of Assistance Other   Educational health offerings Acute disease     Encounter Details   Primary purpose of visit Acute Illness/Condition Visit   Was an Emergency Department visit averted? Not Applicable   Does patient have a medical provider? Yes   Patient referred to Follow up with established PCP   Was a mental health screening completed? (GAINS tool) No   Does patient have dental issues? No   Does patient have vision issues? No   Does your patient have an abnormal blood pressure today? No   Since previous encounter, have you referred patient for abnormal blood pressure that resulted in a new diagnosis or medication change? No   Does your patient have an abnormal blood glucose today? No   Since previous encounter, have you referred patient for abnormal blood glucose that resulted in a new diagnosis or medication change? No   Was there a life-saving intervention made? No    Cleint continues to have lower extremity edema. Given knee high compression stockings and reviewed elevating feet as much as allowed.

## 2016-10-08 ENCOUNTER — Encounter: Payer: Self-pay | Admitting: Pediatric Intensive Care

## 2016-10-24 NOTE — Congregational Nurse Program (Signed)
Congregational Nurse Program Note  Date of Encounter: 09/20/2016  Past Medical History: No past medical history on file.  Encounter Details: Cleint states that she was [facility]. CN will noity provider and [facility]

## 2016-11-03 NOTE — Congregational Nurse Program (Signed)
Congregational Nurse Program Note  Date of Encounter: 10/08/2016  Past Medical History: No past medical history on file.  Encounter Details:     CNP Questionnaire - 10/08/16 0900      Patient Demographics   Is this a new or existing patient? Existing   Patient is considered a/an Not Applicable   Race African-American/Black     Patient Assistance   Location of Patient Assistance [facility]   Patient's financial/insurance status Self-Pay (Uninsured);Low Income   Uninsured Patient ([facility] Card/Care Connects) Yes   Interventions Appt. has been completed   Patient referred to apply for the following financial assistance Medicaid   Food insecurities addressed Not Applicable   Transportation assistance No   Type of Assistance Other   Assistance securing medications Yes   Type of Holiday representative health offerings Medications;Navigating the healthcare system     Encounter Details   Primary purpose of visit Navigating the Healthcare System   Was an Emergency Department visit averted? Not Applicable   Does patient have a medical provider? Yes   Patient referred to Follow up with established PCP   Was a mental health screening completed? (GAINS tool) No   Does patient have dental issues? No   Does patient have vision issues? No   Does your patient have an abnormal blood pressure today? No   Since previous encounter, have you referred patient for abnormal blood pressure that resulted in a new diagnosis or medication change? No   Does your patient have an abnormal blood glucose today? No   Since previous encounter, have you referred patient for abnormal blood glucose that resulted in a new diagnosis or medication change? No   Was there a life-saving intervention made? No     Client states that she does not have Medicaid but has applied for Medicaid and disability. CN advised client to let her know if Medicaid is approved. CN contacted [facility] regarding  mirtazipine prescription. Pharmacy never received prescription. CN advised client call her provider at [facility] to clarify.

## 2016-11-11 ENCOUNTER — Encounter: Payer: Self-pay | Admitting: Pediatric Intensive Care

## 2016-11-11 NOTE — Congregational Nurse Program (Signed)
Congregational Nurse Program Note  Date of Encounter: 11/11/2016  Past Medical History: No past medical history on file.  Encounter Details:     CNP Questionnaire - 11/11/16 1000      Patient Demographics   Is this a new or existing patient? Existing   Patient is considered a/an Not Applicable   Race African-American/Black     Patient Assistance   Location of Patient Assistance [facility]   Patient's financial/insurance status Self-Pay (Uninsured);Low Income   Uninsured Patient ([place] Card/Care Connects) Yes   Interventions Not Applicable   Patient referred to apply for the following financial assistance Not Applicable   Food insecurities addressed Not Applicable   Transportation assistance No   Assistance securing medications Yes   Type of Holiday representative health offerings Medications;Navigating the healthcare system     Encounter Details   Primary purpose of visit Navigating the Healthcare System   Was an Emergency Department visit averted? Not Applicable   Does patient have a medical provider? Yes   Patient referred to Follow up with established PCP   Was a mental health screening completed? (GAINS tool) No   Does patient have dental issues? No   Does patient have vision issues? No   Does your patient have an abnormal blood pressure today? No   Since previous encounter, have you referred patient for abnormal blood pressure that resulted in a new diagnosis or medication change? No   Does your patient have an abnormal blood glucose today? No   Since previous encounter, have you referred patient for abnormal blood glucose that resulted in a new diagnosis or medication change? No   Was there a life-saving intervention made? No     Out of lamictal and keppra. [facility] will deliver medication this afternoon.

## 2016-12-24 ENCOUNTER — Encounter: Payer: Self-pay | Admitting: Pediatric Intensive Care

## 2017-01-16 NOTE — Congregational Nurse Program (Signed)
Congregational Nurse Program Note  Date of Encounter: 12/24/2016  Past Medical History: No past medical history on file.  Encounter Details: CNP Questionnaire - 12/24/16 0930      Questionnaire   Patient Status  Not Applicable    Race  Black or African American    Location [facility] Served [facility]  [employer]    Insurance  Not Applicable    Uninsured  Uninsured (NEW 1x/quarter)    Food  No food insecurities    Housing/Utilities  No permanent housing    Transportation  Yes, need transportation assistance    Interpersonal Safety  Yes, feel physically and emotionally safe where you currently live    Medication  Yes, have medication insecurities;Provided medication assistance    Medical Provider  Yes    Referrals  Not Applicable    ED Visit Averted  Not Applicable    Life-Saving Intervention Made  Not Applicable      Client needs Keppra refill. Cn contacted [facility] for refill and it will be delivered this afternoon. Client aware. Follow up in CN clinic as needed.

## 2017-01-21 ENCOUNTER — Encounter: Payer: Self-pay | Admitting: Pediatric Intensive Care

## 2017-02-04 NOTE — Congregational Nurse Program (Signed)
Congregational Nurse Program Note  Date of Encounter: 01/21/2017  Past Medical History: No past medical history on file.  Encounter Details: CNP Questionnaire - 01/21/17 0900      Questionnaire   Patient Status  Not Applicable    Race  Black or African American    Location [facility] Served [facility]  [employer]    Insurance  Not Applicable    Uninsured  Uninsured (Subsequent visits/quarter)    Food  No food insecurities    Housing/Utilities  No permanent housing    Transportation  Yes, need transportation assistance    Interpersonal Safety  Yes, feel physically and emotionally safe where you currently live    Medication  Yes, have medication insecurities;Provided medication assistance    Medical Provider  Yes    Referrals  Medication Assistance    ED Visit Averted  Not Applicable    Life-Saving Intervention Made  Not Applicable      Needs medication refills. States that she is now taking Keppra 1000mg  tid and lamictal 100mg  (25mg  tab x4) bid. CN will confirm current medication dosages and refills with [facility] and arrange delivery to client. Client states that she has her disability hearing this week.

## 2017-02-25 ENCOUNTER — Encounter: Payer: Self-pay | Admitting: Pediatric Intensive Care

## 2017-03-25 NOTE — Congregational Nurse Program (Signed)
Congregational Nurse Program Note  Date of Encounter: 02/25/2017  Past Medical History: No past medical history on file.  Encounter Details: CNP Questionnaire - 02/25/17 0930      Questionnaire   Patient Status  Not Applicable    Race  Black or African American    [facility] Served [facility]  [employer]    Insurance  Not Applicable    Uninsured  Uninsured (Subsequent visits/quarter)    Food  No food insecurities    Housing/Utilities  No permanent housing    Transportation  No transportation needs    Interpersonal Safety  Yes, feel physically and emotionally safe where you currently live    Medication  Yes, have medication insecurities;Provided medication assistance    Medical Provider  Yes    Referrals  Medication Assistance    ED Visit Averted  Not Applicable    Life-Saving Intervention Made  Not Applicable      [facility] Waiting on disability/Medicaid.

## 2017-03-31 ENCOUNTER — Encounter: Payer: Self-pay | Admitting: Pediatric Intensive Care

## 2017-04-24 NOTE — Congregational Nurse Program (Signed)
Congregational Nurse Program Note  Date of Encounter: 03/31/2017  Past Medical History: No past medical history on file.  Encounter Details: CNP Questionnaire - 03/31/17 0930      Questionnaire   Patient Status  Not Applicable    Race  Black or African American    [facility] Served At  [employer]    Insurance  Not Applicable    Uninsured  Uninsured (Subsequent visits/quarter)    Food  No food insecurities    Housing/Utilities  No permanent housing    Transportation  No transportation needs    Interpersonal Safety  Yes, feel physically and emotionally safe where you currently live    Medication  Yes, have medication insecurities;Provided medication assistance    Medical Provider  Yes    Referrals  Medication Assistance    ED Visit Averted  Not Applicable    Life-Saving Intervention Made  Not Applicable      Client needs medication refills. She has not received Medicaid yet.

## 2017-04-29 ENCOUNTER — Encounter: Payer: Self-pay | Admitting: Pediatric Intensive Care

## 2017-05-15 DIAGNOSIS — R51 Headache: Secondary | ICD-10-CM | POA: Diagnosis not present

## 2017-05-15 DIAGNOSIS — G40209 Localization-related (focal) (partial) symptomatic epilepsy and epileptic syndromes with complex partial seizures, not intractable, without status epilepticus: Secondary | ICD-10-CM | POA: Diagnosis not present

## 2017-05-19 ENCOUNTER — Encounter: Payer: Self-pay | Admitting: Critical Care Medicine

## 2017-05-19 ENCOUNTER — Encounter: Payer: Self-pay | Admitting: Pediatric Intensive Care

## 2017-05-19 NOTE — Congregational Nurse Program (Signed)
Congregational Nurse Program Note  Date of Encounter: 05/19/2017  Past Medical History: No past medical history on file.  Encounter Details: CNP Questionnaire - 05/19/17 0945      Questionnaire   Patient Status  Not Applicable    Race  Black or African American    Location [facility] Served At  [employer]    Insurance  Not Applicable    Uninsured  Uninsured (NEW 1x/quarter)    Food  No food insecurities    Housing/Utilities  No permanent housing    Transportation  No transportation needs    Interpersonal Safety  Yes, feel physically and emotionally safe where you currently live    Medication  Yes, have medication insecurities;Provided medication assistance    Medical Provider  Yes    Referrals  Medication Assistance    ED Visit Averted  Not Applicable    Life-Saving Intervention Made  Not Applicable      Client states she saw her neurologist last week and has had medication change. [facility] that [facility] There are no updated noted from provider. CN contacted [facility]. Lamictal has been changed to once daily. CN will have client sign referral form.

## 2017-05-20 NOTE — Congregational Nurse Program (Signed)
Congregational Nurse Program Note  Date of Encounter: 04/29/2017  Past Medical History: No past medical history on file.  Encounter Details: CNP Questionnaire - 05/19/17 0945      Questionnaire   Patient Status  Not Applicable    Race  Black or African American    Location [facility] Served At  [employer]    Insurance  Not Applicable    Uninsured  Uninsured (NEW 1x/quarter)    Food  No food insecurities    Housing/Utilities  No permanent housing    Transportation  No transportation needs    Interpersonal Safety  Yes, feel physically and emotionally safe where you currently live    Medication  Yes, have medication insecurities;Provided medication assistance    Medical Provider  Yes    Referrals  Medication Assistance    ED Visit Averted  Not Applicable    Life-Saving Intervention Made  Not Applicable      BP check. Client states that she has neurology appointment later this month. [facility]. Referral form signed.Follow up in CN clinic as needed.

## 2017-06-02 ENCOUNTER — Encounter: Payer: Self-pay | Admitting: Pediatric Intensive Care

## 2017-06-26 NOTE — Congregational Nurse Program (Signed)
Congregational Nurse Program Note  Date of Encounter: 06/02/2017  Past Medical History: No past medical history on file.  Encounter Details: CNP Questionnaire - 06/02/17 0845      Questionnaire   Patient Status  Not Applicable    Race  Black or African American    Location Patient Served At  [employer]  Not Applicable    Uninsured  Uninsured (Subsequent visits/quarter)    Food  No food insecurities    Housing/Utilities  No permanent housing    Transportation  No transportation needs    Interpersonal Safety  Yes, feel physically and emotionally safe where you currently live    Medication  Yes, have medication insecurities    Medical Provider  Yes    Referrals  Primary Care Provider/Clinic    ED Visit Averted  Not Applicable    Life-Saving Intervention Made  Not Applicable      BP check. CN made PCP appointment with client for [facility].

## 2017-07-01 ENCOUNTER — Other Ambulatory Visit: Payer: Self-pay

## 2017-07-01 ENCOUNTER — Encounter (INDEPENDENT_AMBULATORY_CARE_PROVIDER_SITE_OTHER): Payer: Self-pay | Admitting: Nurse Practitioner

## 2017-07-01 ENCOUNTER — Ambulatory Visit (INDEPENDENT_AMBULATORY_CARE_PROVIDER_SITE_OTHER): Payer: Self-pay | Admitting: Nurse Practitioner

## 2017-07-01 DIAGNOSIS — R569 Unspecified convulsions: Secondary | ICD-10-CM

## 2017-07-01 MED ORDER — LAMOTRIGINE 100 MG PO TABS: 100.0000 mg | ORAL_TABLET | Freq: Two times a day (BID) | ORAL | 4 refills | Status: DC

## 2017-07-01 MED ORDER — LEVETIRACETAM 1000 MG PO TABS: 1000.0000 mg | ORAL_TABLET | Freq: Three times a day (TID) | ORAL | 3 refills | Status: DC

## 2017-07-01 MED ORDER — ONDANSETRON HCL 4 MG PO TABS: 4.0000 mg | ORAL_TABLET | Freq: Three times a day (TID) | ORAL | 1 refills | Status: DC | PRN

## 2017-07-01 NOTE — Patient Instructions (Signed)
Epilepsy °Epilepsy is when a person keeps having seizures. A seizure is unusual activity in the brain. A seizure can change how you think or behave, and it can make it hard to be aware of what is happening. °This condition can cause problems, such as: °· Falls, accidents, and injury. °· Depression. °· Poor memory. °· Sudden unexplained death in epilepsy (SUDEP). This is rare. Its cause is not known. ° °Most people with epilepsy lead normal lives. °Follow these instructions at home: °Medicines ° °· Take medicines only as told by your doctor. °· Avoid anything that may keep your medicine from working, such as alcohol. °Activity °· Get enough rest. Lack of sleep can make seizures more likely to occur. °· Follow your doctor’s advice about driving, swimming, and doing anything else that would be dangerous if you had a seizure. °Teaching others °Teach friends and family what to do if you have a seizure. They should: °· Lay you on the ground to prevent a fall. °· Cushion your head and body. °· Loosen any tight clothing around your neck. °· Turn you on your side. °· Stay with you until you are better. °· Not hold you down. °· Not put anything in your mouth. °· Know whether or not you need emergency care. ° °General instructions °· Avoid anything that causes you to have seizures. °· Keep a seizure diary. Write down what you remember about each seizure, and especially what might have caused it. °· Keep all follow-up visits as told by your doctor. This is important. °Contact a doctor if: °· You have a change in your seizure pattern. °· You get an infection or start to feel sick. You may have more seizures when you are sick. °Get help right away if: °· A seizure does not stop after 5 minutes. °· You have more than one seizure in a row, and you do not have enough time between the seizures to feel better. °· A seizure makes it harder to breathe. °· A seizure is different from other seizures you have had. °· A seizure makes you  unable to speak or use a part of your body. °· You did not wake up right after a seizure. °This information is not intended to replace advice given to you by your health care provider. Make sure you discuss any questions you have with your health care provider. °Document Released: 12/02/2008 Document Revised: 09/11/2015 Document Reviewed: 08/15/2015 °[employer]vier Interactive Patient Education © 2018 [employer] ° °

## 2017-07-01 NOTE — Progress Notes (Signed)
Assessment & Plan:  Kathleen Davis was seen today for new patient (initial visit).  Diagnoses and all orders for this visit:  Seizure (HCC) -     lamoTRIgine (LAMICTAL) 100 MG tablet; Take 1 tablet (100 mg total) by mouth 2 (two) times daily. -     ondansetron (ZOFRAN) 4 MG tablet; Take 1 tablet (4 mg total) by mouth every 8 (eight) hours as needed for nausea or vomiting. -     levETIRAcetam (KEPPRA) 1000 MG tablet; Take 1 tablet (1,000 mg total) by mouth 3 (three) times daily. Follow up with neurology as instruced Do not skip medication doses   Patient has been counseled on age-appropriate routine health concerns for screening and prevention. These are reviewed and up-to-date. Referrals have been placed accordingly. Immunizations are up-to-date or declined.    Subjective:   Chief Complaint  Patient presents with  . New Patient (Initial Visit)    seizures   HPI Kathleen Davis 52 y.o. female presents to office today to establish care. She has a history of epilepsy. She also reports a past abnormal PAP smear with no follow up. She has been instructed to schedule for her PAP as soon as she can. She is also due for colonoscopy and mammogram. She endorses a family history of breast cancer as well. I will schedule her for a mammogram with [facility].   Seizures Chronic. Described as "grand mal" per record review of Neurology office notes. Currently being followed by [facility] Neurology. Taking lamictal  BID, Keppra TID and Mg+ oxide  daily for headache prevention. Last office visit 05-15-2017 with follow up in 4 months to Neurology. She has previously taken Dilantin and Depakote which were reportedly not effective. She denies any recent seizure activity.   Headaches Taking Mg+oxide for headache relief which provides significant relief of her symptoms.    Review of Systems  Constitutional: Negative for fever, malaise/fatigue and weight loss.  HENT: Negative.  Negative for  nosebleeds.   Eyes: Negative.  Negative for blurred vision, double vision and photophobia.  Respiratory: Negative.  Negative for cough and shortness of breath.   Cardiovascular: Negative.  Negative for chest pain, palpitations and leg swelling.  Gastrointestinal: Negative.  Negative for heartburn, nausea and vomiting.  Musculoskeletal: Negative.  Negative for myalgias.  Neurological: Positive for seizures and headaches. Negative for dizziness and focal weakness.  Psychiatric/Behavioral: Negative.  Negative for suicidal ideas.    Past Medical History:  Diagnosis Date  . Chronic headaches   . Depression   . Hypertension   . Seizures (HCC)     History reviewed. No pertinent surgical history.  Family History  Problem Relation Age of Onset  . Hypertension Mother   . Diabetes Mother   . Stroke Mother   . Hypertension Father   . Cancer Father   . Seizures Father   . Hypertension Sister   . Diabetes Sister   . Hypertension Brother   . Diabetes Brother     Social History Reviewed with no changes to be made today.   Outpatient Medications Prior to Visit  Medication Sig Dispense Refill  . lamoTRIgine (LAMICTAL) 100 MG tablet Take 100 mg by mouth 2 (two) times daily.    Marland Kitchen levETIRAcetam (KEPPRA) 1000 MG tablet Take 1,000 mg by mouth 3 (three) times daily.    Marland Kitchen levETIRAcetam (KEPPRA) 1000 MG tablet Take 1 tablet (1,000 mg total) by mouth 3 (three) times daily. 69 tablet 0  . ondansetron (ZOFRAN) 4 MG tablet  Take 1 tablet (4 mg total) by mouth every 8 (eight) hours as needed for nausea or vomiting. (Patient not taking: Reported on 07/01/2017) 12 tablet 0   No facility-administered medications prior to visit.     No Known Allergies     Objective:    BP 138/86 (BP Location: Left Arm, Patient Position: Sitting, Cuff Size: Large)   Pulse 81   Temp 97.8 F (36.6 C) (Oral)   Ht  (1.626 m)   Wt 228 lb 3.2 oz (103.5 kg)   LMP 06/13/2011   SpO2 100%   BMI 39.17 kg/m  Wt  Readings from Last 3 Encounters:  07/01/17 228 lb 3.2 oz (103.5 kg)  05/14/16 181 lb (82.1 kg)    Physical Exam  Constitutional: She is oriented to person, place, and time. She appears well-developed and well-nourished. She is cooperative.  HENT:  Head: Normocephalic and atraumatic.  Eyes: EOM are normal.  Neck: Normal range of motion.  Cardiovascular: Normal rate, regular rhythm and normal heart sounds. Exam reveals no gallop and no friction rub.  No murmur heard. Pulmonary/Chest: Effort normal and breath sounds normal. No tachypnea. No respiratory distress. She has no decreased breath sounds. She has no wheezes. She has no rhonchi. She has no rales. She exhibits no tenderness.  Abdominal: Bowel sounds are normal.  Musculoskeletal: Normal range of motion. She exhibits no edema.  Neurological: She is alert and oriented to person, place, and time. She has normal strength. Coordination and gait normal.  Skin: Skin is warm and dry.  Psychiatric: She has a normal mood and affect. Her behavior is normal. Judgment and thought content normal.  Nursing note and vitals reviewed.      Patient has been counseled extensively about nutrition and exercise as well as the importance of adherence with medications and regular follow-up. The patient was given clear instructions to go to ER or return to medical center if symptoms don't improve, worsen or new problems develop. The patient verbalized understanding.   Follow-up: Return in about 2 months (around 08/31/2017) for Needs appointment with financial representative; please discuss how the financial works .   Claiborne Rigg, FNP-BC [facility] [place], [place] [phone]   07/01/2017, 2:46 PM

## 2017-07-10 ENCOUNTER — Telehealth (INDEPENDENT_AMBULATORY_CARE_PROVIDER_SITE_OTHER): Payer: Self-pay | Admitting: Physician Assistant

## 2017-07-10 ENCOUNTER — Other Ambulatory Visit: Payer: Self-pay | Admitting: Nurse Practitioner

## 2017-07-10 DIAGNOSIS — R569 Unspecified convulsions: Secondary | ICD-10-CM

## 2017-07-10 MED ORDER — LAMOTRIGINE 100 MG PO TABS: 100.0000 mg | ORAL_TABLET | Freq: Two times a day (BID) | ORAL | 4 refills | Status: DC

## 2017-07-10 MED ORDER — ONDANSETRON HCL 4 MG PO TABS: 4.0000 mg | ORAL_TABLET | Freq: Three times a day (TID) | ORAL | 1 refills | Status: DC | PRN

## 2017-07-10 MED ORDER — LEVETIRACETAM 1000 MG PO TABS: 1000.0000 mg | ORAL_TABLET | Freq: Three times a day (TID) | ORAL | 3 refills | Status: DC

## 2017-07-10 NOTE — Telephone Encounter (Signed)
Patient needs medications changed to [facility] pharmacy. Kathleen Davis, CMA

## 2017-07-10 NOTE — Telephone Encounter (Signed)
Pt called stating she has not received her medications sent over on 07/01/2017 because her insurance hasen't kicked in, she would like to know what options she has can she transfer them to [facility] pharmacy or cheaper alternatives. Please follow up

## 2017-07-10 NOTE — Telephone Encounter (Signed)
Scripts have sent

## 2017-07-11 MED FILL — lamoTRIgine 100 MG TABS: 100 | 30 days supply | Qty: 60 | Fill #0

## 2017-07-11 MED FILL — ONDANSETRON HCL 4 MG TABLET: 4 | 4 days supply | Qty: 12 | Fill #0

## 2017-07-11 MED FILL — levETIRAcetam 1000 MG TABS: 1000 | 30 days supply | Qty: 90 | Fill #0

## 2017-07-11 NOTE — Telephone Encounter (Signed)
Patient aware. Kathleen Davis, CMA  

## 2017-07-22 ENCOUNTER — Other Ambulatory Visit (INDEPENDENT_AMBULATORY_CARE_PROVIDER_SITE_OTHER): Payer: Self-pay | Admitting: Physician Assistant

## 2017-07-22 ENCOUNTER — Telehealth (INDEPENDENT_AMBULATORY_CARE_PROVIDER_SITE_OTHER): Payer: Self-pay | Admitting: Physician Assistant

## 2017-07-22 DIAGNOSIS — R569 Unspecified convulsions: Secondary | ICD-10-CM

## 2017-07-22 MED ORDER — LAMOTRIGINE 100 MG PO TABS: 100.0000 mg | ORAL_TABLET | Freq: Two times a day (BID) | ORAL | 3 refills | Status: DC

## 2017-07-22 MED ORDER — LEVETIRACETAM 1000 MG PO TABS: 1000.0000 mg | ORAL_TABLET | Freq: Three times a day (TID) | ORAL | 3 refills | Status: DC

## 2017-07-22 NOTE — Telephone Encounter (Signed)
Please advise. Tempestt S Roberts, CMA  

## 2017-07-22 NOTE — Telephone Encounter (Signed)
Attempted to call and inform patient. Phone rang several times then voicemail came on, but voicemail not set up yet. Will call back. Kathleen Davis S Kathleen Davis, CMA

## 2017-07-22 NOTE — Telephone Encounter (Signed)
Noted  
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
## 2017-07-22 NOTE — Telephone Encounter (Signed)
I have sent Keppra and Lamictal to [facility] at the same doses that she reported in Epic.

## 2017-07-24 ENCOUNTER — Telehealth (INDEPENDENT_AMBULATORY_CARE_PROVIDER_SITE_OTHER): Payer: Self-pay | Admitting: Physician Assistant

## 2017-07-24 NOTE — Telephone Encounter (Signed)
Pt called stating she has not been able to sleep,which has made it hard to adjust to her new seizure medication. Please advise

## 2017-07-24 NOTE — Telephone Encounter (Signed)
Thank you for your documentation.  

## 2017-07-24 NOTE — Telephone Encounter (Signed)
Patient notified that she would need to schedule an appointment and be evaluated in the clinic. No available appointments until first week of July. Per PCP advised patient to go to Urgent Care. Maryjean Mornempestt S Roberts, CMA

## 2017-07-28 ENCOUNTER — Encounter (HOSPITAL_COMMUNITY): Payer: Self-pay

## 2017-07-28 ENCOUNTER — Emergency Department (HOSPITAL_COMMUNITY)
Admission: EM | Admit: 2017-07-28 | Discharge: 2017-07-29 | Disposition: A | Discharge status: Other Acute Inpatient Hospital | Payer: Self-pay | Attending: Physician Assistant | Admitting: Physician Assistant

## 2017-07-28 DIAGNOSIS — Z87891 Personal history of nicotine dependence: Secondary | ICD-10-CM | POA: Insufficient documentation

## 2017-07-28 DIAGNOSIS — R440 Auditory hallucinations: Secondary | ICD-10-CM

## 2017-07-28 DIAGNOSIS — Z046 Encounter for general psychiatric examination, requested by authority: Secondary | ICD-10-CM | POA: Insufficient documentation

## 2017-07-28 DIAGNOSIS — F29 Unspecified psychosis not due to a substance or known physiological condition: Secondary | ICD-10-CM | POA: Insufficient documentation

## 2017-07-28 DIAGNOSIS — I10 Essential (primary) hypertension: Secondary | ICD-10-CM | POA: Insufficient documentation

## 2017-07-28 DIAGNOSIS — G479 Sleep disorder, unspecified: Secondary | ICD-10-CM | POA: Insufficient documentation

## 2017-07-28 DIAGNOSIS — F419 Anxiety disorder, unspecified: Secondary | ICD-10-CM | POA: Insufficient documentation

## 2017-07-28 DIAGNOSIS — R569 Unspecified convulsions: Secondary | ICD-10-CM | POA: Diagnosis not present

## 2017-07-28 LAB — CBC
HCT: 36.6 % (ref 36.0–46.0)
Hemoglobin: 11.6 g/dL — ABNORMAL LOW (ref 12.0–15.0)
MCH: 30 pg (ref 26.0–34.0)
MCHC: 31.7 g/dL (ref 30.0–36.0)
MCV: 94.6 fL (ref 78.0–100.0)
PLATELETS: 305 10*3/uL (ref 150–400)
RBC: 3.87 MIL/uL (ref 3.87–5.11)
RDW: 13.6 % (ref 11.5–15.5)
WBC: 5.3 10*3/uL (ref 4.0–10.5)

## 2017-07-28 LAB — RAPID URINE DRUG SCREEN, HOSP PERFORMED
Amphetamines: NOT DETECTED
BENZODIAZEPINES: NOT DETECTED
Barbiturates: NOT DETECTED
Cocaine: NOT DETECTED
OPIATES: NOT DETECTED
Tetrahydrocannabinol: POSITIVE — AB

## 2017-07-28 LAB — COMPREHENSIVE METABOLIC PANEL
ALK PHOS: 116 U/L (ref 38–126)
ALT: 12 U/L — AB (ref 14–54)
ANION GAP: 7 (ref 5–15)
AST: 20 U/L (ref 15–41)
Albumin: 3.9 g/dL (ref 3.5–5.0)
BILIRUBIN TOTAL: 0.6 mg/dL (ref 0.3–1.2)
BUN: 16 mg/dL (ref 6–20)
CALCIUM: 9.1 mg/dL (ref 8.9–10.3)
CO2: 25 mmol/L (ref 22–32)
CREATININE: 1.06 mg/dL — AB (ref 0.44–1.00)
Chloride: 110 mmol/L (ref 101–111)
GFR calc non Af Amer: 60 mL/min — ABNORMAL LOW (ref >60)
Glucose, Bld: 93 mg/dL (ref 65–99)
Potassium: 3.5 mmol/L (ref 3.5–5.1)
SODIUM: 142 mmol/L (ref 135–145)
TOTAL PROTEIN: 7.5 g/dL (ref 6.5–8.1)

## 2017-07-28 LAB — ETHANOL: Alcohol, Ethyl (B): <10 mg/dL (ref <10)

## 2017-07-28 LAB — I-STAT BETA HCG BLOOD, ED (MC, WL, AP ONLY)

## 2017-07-28 MED ORDER — ALUM & MAG HYDROXIDE-SIMETH 200-200-20 MG/5ML PO SUSP: 30.0000 mL | Freq: Four times a day (QID) | ORAL | Status: DC | PRN
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
## 2017-07-28 NOTE — ED Triage Notes (Signed)
Pt hasn't slept in about 4 days Pt called EMS for seizures because "someone" was telling her she was going to have a seizure

## 2017-07-28 NOTE — ED Notes (Signed)
Bed: WTR7 Expected date:  Expected time:  Means of arrival:  Comments: 

## 2017-07-29 ENCOUNTER — Other Ambulatory Visit: Payer: Self-pay

## 2017-07-29 ENCOUNTER — Inpatient Hospital Stay (HOSPITAL_COMMUNITY)
Admission: AD | Admit: 2017-07-29 | Discharge: 2017-08-01 | DRG: 885 | Disposition: A | Discharge status: Home / Self Care | Payer: Medicare Other | Source: Intra-hospital | Attending: Psychiatry | Admitting: Psychiatry

## 2017-07-29 ENCOUNTER — Encounter (HOSPITAL_COMMUNITY): Payer: Self-pay | Admitting: *Deleted

## 2017-07-29 DIAGNOSIS — F29 Unspecified psychosis not due to a substance or known physiological condition: Secondary | ICD-10-CM | POA: Diagnosis present

## 2017-07-29 DIAGNOSIS — R569 Unspecified convulsions: Secondary | ICD-10-CM

## 2017-07-29 DIAGNOSIS — Z833 Family history of diabetes mellitus: Secondary | ICD-10-CM

## 2017-07-29 DIAGNOSIS — F419 Anxiety disorder, unspecified: Secondary | ICD-10-CM | POA: Diagnosis present

## 2017-07-29 DIAGNOSIS — G4700 Insomnia, unspecified: Secondary | ICD-10-CM | POA: Diagnosis present

## 2017-07-29 DIAGNOSIS — F23 Brief psychotic disorder: Secondary | ICD-10-CM | POA: Diagnosis present

## 2017-07-29 DIAGNOSIS — Z823 Family history of stroke: Secondary | ICD-10-CM | POA: Diagnosis not present

## 2017-07-29 DIAGNOSIS — Z8249 Family history of ischemic heart disease and other diseases of the circulatory system: Secondary | ICD-10-CM

## 2017-07-29 DIAGNOSIS — Z87891 Personal history of nicotine dependence: Secondary | ICD-10-CM | POA: Diagnosis not present

## 2017-07-29 DIAGNOSIS — G40909 Epilepsy, unspecified, not intractable, without status epilepticus: Secondary | ICD-10-CM | POA: Diagnosis present

## 2017-07-29 DIAGNOSIS — F17211 Nicotine dependence, cigarettes, in remission: Secondary | ICD-10-CM | POA: Diagnosis not present

## 2017-07-29 HISTORY — DX: Unspecified psychosis not due to a substance or known physiological condition: F29

## 2017-07-29 MED ORDER — ALUM & MAG HYDROXIDE-SIMETH 200-200-20 MG/5ML PO SUSP: 30.0000 mL | Freq: Four times a day (QID) | ORAL | Status: DC | PRN

## 2017-07-29 MED ORDER — HYDROXYZINE HCL 25 MG PO TABS
25.0000 mg | ORAL_TABLET | Freq: Four times a day (QID) | ORAL | Status: DC | PRN
  Administered 2017-07-29 – 2017-07-31 (×3): 25 mg via ORAL
  Filled 2017-07-29 (×3): qty 1
  Filled 2017-07-29: qty 10

## 2017-07-29 MED ORDER — LEVETIRACETAM 500 MG PO TABS
1000.0000 mg | ORAL_TABLET | Freq: Three times a day (TID) | ORAL | Status: DC
  Administered 2017-07-29: 1000 mg via ORAL
  Filled 2017-07-29: qty 2

## 2017-07-29 MED ORDER — RISPERIDONE 0.5 MG PO TABS
0.5000 mg | ORAL_TABLET | Freq: Two times a day (BID) | ORAL | Status: DC
  Administered 2017-07-29 – 2017-07-30 (×2): 0.5 mg via ORAL
  Filled 2017-07-29 (×8): qty 1

## 2017-07-29 MED ORDER — ACETAMINOPHEN 325 MG PO TABS: 650.0000 mg | ORAL_TABLET | Freq: Four times a day (QID) | ORAL | Status: DC | PRN

## 2017-07-29 MED ORDER — LAMOTRIGINE 100 MG PO TABS
100.0000 mg | ORAL_TABLET | Freq: Two times a day (BID) | ORAL | Status: DC
  Administered 2017-07-29 (×2): 100 mg via ORAL
  Filled 2017-07-29 (×2): qty 1

## 2017-07-29 MED ORDER — RISPERIDONE 0.5 MG PO TABS
0.5000 mg | ORAL_TABLET | Freq: Two times a day (BID) | ORAL | Status: DC
  Filled 2017-07-29: qty 1

## 2017-07-29 MED ORDER — LAMOTRIGINE 100 MG PO TABS
100.0000 mg | ORAL_TABLET | Freq: Two times a day (BID) | ORAL | Status: DC
  Administered 2017-07-29 – 2017-08-01 (×6): 100 mg via ORAL
  Filled 2017-07-29 (×5): qty 1
  Filled 2017-07-29: qty 14
  Filled 2017-07-29: qty 1
  Filled 2017-07-29: qty 14
  Filled 2017-07-29 (×4): qty 1

## 2017-07-29 MED ORDER — MAGNESIUM HYDROXIDE 400 MG/5ML PO SUSP: 30.0000 mL | Freq: Every day | ORAL | Status: DC | PRN

## 2017-07-29 MED ORDER — TRAZODONE HCL 50 MG PO TABS
50.0000 mg | ORAL_TABLET | Freq: Every evening | ORAL | Status: DC | PRN
  Administered 2017-07-29 – 2017-07-31 (×3): 50 mg via ORAL
  Filled 2017-07-29 (×3): qty 1

## 2017-07-29 MED ORDER — ONDANSETRON HCL 4 MG PO TABS: 4.0000 mg | ORAL_TABLET | Freq: Three times a day (TID) | ORAL | Status: DC | PRN

## 2017-07-29 MED ORDER — LEVETIRACETAM 500 MG PO TABS
1000.0000 mg | ORAL_TABLET | Freq: Three times a day (TID) | ORAL | Status: DC
  Administered 2017-07-29 – 2017-08-01 (×9): 1000 mg via ORAL
  Filled 2017-07-29: qty 20
  Filled 2017-07-29 (×4): qty 2
  Filled 2017-07-29: qty 20
  Filled 2017-07-29 (×5): qty 2
  Filled 2017-07-29: qty 20
  Filled 2017-07-29 (×4): qty 2

## 2017-07-29 NOTE — BH Assessment (Addendum)
Assessment Note  Kathleen Davis is an 52 y.o. female.  -Clinician reviewed note by Dierdre Forth, PA.  Department with concerns that she is not sleeping.  Pt reports it has been 9-10 days that her "neighbors have been hollering" and she has been unable to sleep.  Pt reports all day and night her neighbors holler "seizure, seizure, stable, stable."  Pt states she has had the police and EMS come out 2 separate times to help with this but they were unable to hear the neighbors.  She reports they have a "monitoring machine and are monitoring my seizures from over there." She reports "no one next door is a doctor or nurse so they shouldn't have monitoring machines."   Patient says that she has not gotten any sleep in the last three months.  She says that she recently moved into this apartment about a month ago.  Patient says that she kept hearing neighbors say "seizure, seizure, clear clear."  She says she got tired of hearing this and threw her shoe against the wall. She then called EMS because she had not gotten any sleep.  Patient does say that the neighbors have equipment with which they monitor her seizures.   Patient denies any SI, HI or A/V hallucinations.  She also denies use of ETOH or other drugs.  Pt has been without sleep for the last 3 days.  She says she has panic attacks.  Had one recently.  Patient says she has no outpatient care.  No previous outpatient services also.  -Clinician discussed patient care with Nira Conn, FNP.  He recommends inpatient psychiatric care.  Clinician informed Dierdre Forth, PA of the disposition.  TTS to seek placement.   Diagnosis: F41.1 Generalized Anxiety D/O  Past Medical History:  Past Medical History:  Diagnosis Date  . Chronic headaches   . Depression   . Hypertension   . Seizures (HCC)     History reviewed. No pertinent surgical history.  Family History:  Family History  Problem Relation Age of Onset  . Hypertension Mother   .  Diabetes Mother   . Stroke Mother   . Hypertension Father   . Cancer Father   . Seizures Father   . Hypertension Sister   . Diabetes Sister   . Hypertension Brother   . Diabetes Brother     Social History:  reports that she has quit smoking. Her smoking use included cigarettes. She has never used smokeless tobacco. She reports that she does not drink alcohol or use drugs.  Additional Social History:  Alcohol / Drug Use Pain Medications: See PTA medication list Prescriptions: See PTA medication list Over the Counter: See PTA medication list History of alcohol / drug use?: No history of alcohol / drug abuse  CIWA: CIWA-Ar BP: (!) 143/99 Pulse Rate: 82 COWS:    Allergies: No Known Allergies  Home Medications:  (Not in a hospital admission)  OB/GYN Status:  Patient's last menstrual period was 06/13/2011.  General Assessment Data Location of Assessment: [facility] TTS Assessment: In system Is this a Tele or Face-to-Face Assessment?: Face-to-Face Is this an Initial Assessment or a Re-assessment for this encounter?: Initial Assessment Marital status: Single Is patient pregnant?: No Pregnancy Status: No Living Arrangements: Alone Can pt return to current living arrangement?: Yes Admission Status: Voluntary Is patient capable of signing voluntary admission?: Yes Referral Source: Self/Family/Friend(Pt called EMS.) Insurance type: None     Crisis Care Plan Living Arrangements: Alone Name of Psychiatrist: None Name of  Therapist: None  Education Status Is patient currently in school?: No Is the patient employed, unemployed or receiving disability?: Unemployed(Has applied for disability)  Risk to self with the past 6 months Suicidal Ideation: No Has patient been a risk to self within the past 6 months prior to admission? : No Suicidal Intent: No Has patient had any suicidal intent within the past 6 months prior to admission? : No Is patient at risk for suicide?:  No Suicidal Plan?: No Has patient had any suicidal plan within the past 6 months prior to admission? : No Access to Means: No What has been your use of drugs/alcohol within the last 12 months?: Denies Previous Attempts/Gestures: Yes How many times?: 1 Other Self Harm Risks: None Triggers for Past Attempts: None known Intentional Self Injurious Behavior: None Family Suicide History: No Recent stressful life event(s): Recent negative physical changes, Financial Problems Persecutory voices/beliefs?: Yes Depression: Yes Depression Symptoms: Loss of interest in usual pleasures, Insomnia, Despondent Substance abuse history and/or treatment for substance abuse?: Yes Suicide prevention information given to non-admitted patients: Not applicable  Risk to Others within the past 6 months Homicidal Ideation: No Does patient have any lifetime risk of violence toward others beyond the six months prior to admission? : No Thoughts of Harm to Others: No Current Homicidal Intent: No Current Homicidal Plan: No Access to Homicidal Means: No Identified Victim: No one History of harm to others?: Yes Assessment of Violence: In distant past Violent Behavior Description: "In my younger days. Does patient have access to weapons?: No Criminal Charges Pending?: No Does patient have a court date: No Is patient on probation?: No  Psychosis Hallucinations: Auditory(Hearing neighbors / feels they are monitoring her) Delusions: Persecutory(Neighbors monitoring her seizures.)  Mental Status Report Appearance/Hygiene: Unremarkable, In scrubs Eye Contact: Fair Motor Activity: Freedom of movement Speech: Logical/coherent Level of Consciousness: Alert Mood: Anxious, Despair, Helpless Affect: Appropriate to circumstance, Depressed Anxiety Level: Panic Attacks Panic attack frequency: 2x/W Most recent panic attack: Tonight Thought Processes: Coherent, Relevant Judgement: Unimpaired Orientation: Person,  Place, Situation Obsessive Compulsive Thoughts/Behaviors: None  Cognitive Functioning Concentration: Normal Memory: Recent Impaired, Remote Intact Is patient IDD: No Is patient DD?: No Insight: Fair Impulse Control: Poor Appetite: Poor Have you had any weight changes? : No Change Sleep: Decreased Total Hours of Sleep: (No sleep in last 3 days) Vegetative Symptoms: None  ADLScreening [facility] Assessment Services) Patient's cognitive ability adequate to safely complete daily activities?: Yes Patient able to express need for assistance with ADLs?: Yes Independently performs ADLs?: Yes (appropriate for developmental age)  Prior Inpatient Therapy Prior Inpatient Therapy: No  Prior Outpatient Therapy Prior Outpatient Therapy: No Does patient have an ACCT team?: No Does patient have Intensive In-House Services?  : No Does patient have [facility] services? : No Does patient have P4CC services?: No  ADL Screening (condition at time of admission) Patient's cognitive ability adequate to safely complete daily activities?: Yes Is the patient deaf or have difficulty hearing?: No Does the patient have difficulty seeing, even when wearing glasses/contacts?: Yes(Some light sensitivity.) Does the patient have difficulty concentrating, remembering, or making decisions?: Yes("Every now and then.") Patient able to express need for assistance with ADLs?: Yes Does the patient have difficulty dressing or bathing?: No Independently performs ADLs?: Yes (appropriate for developmental age) Does the patient have difficulty walking or climbing stairs?: Yes(Takes a long time she reports.  Frequent stops 'to get rest.") Weakness of Legs: None Weakness of Arms/Hands: None       Abuse/Neglect  Assessment (Assessment to be complete while patient is alone) Abuse/Neglect Assessment Can Be Completed: Yes Physical Abuse: Denies Verbal Abuse: Denies Sexual Abuse: Denies Exploitation of patient/patient's  resources: Denies Self-Neglect: Denies     Merchant navy officerAdvance Directives (For Healthcare) Does Patient Have a Medical Advance Directive?: No, Yes Type of Advance Directive: Living will Would patient like information on creating a medical advance directive?: No - Patient declined          Disposition:  Disposition Initial Assessment Completed for this Encounter: Yes Patient referred to: Other (Comment)(To be reviwed by FNP)  On Site Evaluation by:   Reviewed with Physician:    Beatriz StallionHarvey, Makela Niehoff Ray 07/29/2017 1:23 AM

## 2017-07-29 NOTE — ED Notes (Signed)
[facility] Expected date:  Expected time:  Means of arrival:  Comments: [place]

## 2017-07-29 NOTE — ED Notes (Signed)
Patient alert, calm and cooperative.  Ate lunch and is now watching TV.

## 2017-07-29 NOTE — BH Assessment (Signed)
[facility]  Per Juanetta BeetsJacqueline Norman, DO, this pt requires psychiatric hospitalization.  Malva LimesLinsey Strader, RN, [id number]  Dr Sharma CovertNorman also finds that pt meets criteria for IVC, which she has initiated.  [place], and at [employer]14:00 Magistrate Watts confirms receipt.  He has since faxed Findings and Custody Order to this Clinical research associatewriter.  At 14:14 I called [employer] and spoke to Amerisource[employer]ergen Corporationperator [id number], who took demographic information, agreeing to dispatch law enforcement to fill out Return of Service, which is pending as of this writing.  Pt's nurse, Aram BeechamCynthia, has been notified, and agrees to call report to [phone].  Pt is to be transported via Patent examinerlaw enforcement.   Doylene Canninghomas Juvon Teater, [place] Behavioral Health Coordinator [phone]

## 2017-07-29 NOTE — Progress Notes (Signed)
Kathleen LundJanet is a 52 year old female pt admitted on involuntary basis. On admission she spoke about how she has not been able to sleep and spoke about her neighbors and how they are doing various things to make her have a seizure. She reports that she called the ambulance to take her to the hospital so she would not have a seizure. She reports that she has not had a seizure in awhile and spoke about how she knows how to stop them when she feels them coming on. She denies any drug use, denies that she has ever been hospitalized before and reports that she takes all her medications as she should. She denies any SI,HI or A/V hallucinations and she is able to contract for safety while in the hospital. She reports that she lives alone and reports that she will go back there after discharge. Kathleen LundJanet was oriented to the unit and safety maintained.

## 2017-07-29 NOTE — ED Notes (Signed)
[facility].  Waiting for IVC papers to be served before calling transport.

## 2017-07-29 NOTE — Tx Team (Signed)
Initial Treatment Plan 07/29/2017 4:10 PM Kathleen CitrinJanet Davis RUE:[id number]    PATIENT STRESSORS: Medication change or noncompliance Other: conflict with neighbors   PATIENT STRENGTHS: Ability for insight Average or above average intelligence Capable of independent living General fund of knowledge   PATIENT IDENTIFIED PROBLEMS: Insomnia Paranoia Psychosis "coping skills"                     DISCHARGE CRITERIA:  Ability to meet basic life and health needs Improved stabilization in mood, thinking, and/or behavior Verbal commitment to aftercare and medication compliance  PRELIMINARY DISCHARGE PLAN: Attend aftercare/continuing care group Return to previous living arrangement  PATIENT/FAMILY INVOLVEMENT: This treatment plan has been presented to and reviewed with the patient, Kathleen CitrinJanet Davis, and/or family member, .  The patient and family have been given the opportunity to ask questions and make suggestions.  Kathleen Davis, Kathleen Davis, [place]RN 07/29/2017, 4:10 PM

## 2017-07-29 NOTE — ED Notes (Signed)
Dr. Sharma CovertNorman made aware that patient refused Risperdal.

## 2017-07-29 NOTE — ED Notes (Signed)
Pt presents with SI, no plan.  Denies HI or AVH.  Pt reports neighbors 2 doors down are causing her to have a seizure.  No seizure activity noted.  No sleep in 4 days noted.  Feeling hopeless.  A&O x 3, no distress noted, calm & cooperative.  Monitoring for safety, Q 15 min checks in effect.

## 2017-07-29 NOTE — ED Notes (Signed)
Patient left with police alert and oriented, calm and cooperative.

## 2017-07-30 DIAGNOSIS — F23 Brief psychotic disorder: Principal | ICD-10-CM

## 2017-07-30 MED ORDER — RISPERIDONE 1 MG PO TABS
1.0000 mg | ORAL_TABLET | Freq: Every day | ORAL | Status: DC
  Administered 2017-07-30: 1 mg via ORAL
  Filled 2017-07-30 (×4): qty 1

## 2017-07-30 NOTE — Progress Notes (Signed)
Recreation Therapy Notes  INPATIENT RECREATION THERAPY ASSESSMENT  Patient Details Name: Kathleen Davis MRN: [id number] DOB: Dec 20, 1965 Today's Date: 07/30/2017       Information Obtained From: Patient  Able to Participate in Assessment/Interview: Yes  Patient Presentation: Alert, Oriented  Reason for Admission (Per Patient): Other (Comments)(Depressed)  Patient Stressors: Family(Son not working)  PharmacologistCoping Skills:   TV, Sports, Music, Exercise, Talk, Prayer, Avoidance, Read, [employer] Bath/Shower  Leisure Interests (2+):  Exercise - Running, Games - Cards, Sports - Exercise (Comment)(Tennis; Encarnacion Slatesaebo)  Frequency of Recreation/Participation: Weekly  Awareness of Community Resources:  Yes  Community Resources:  Restaurants  Current Use: Yes  If no, Barriers?:    Expressed Interest in [employer] CorporationCommunity Resource Information: Yes  County of Residence:  [place]  Patient Main Form of Transportation: Car(Sometimes public transportation)  Patient Strengths:  Being happy; let kids live their own lives  Patient Identified Areas of Improvement:  Cooking; being happy  Patient Goal for Hospitalization:  "To get better"  Current SI (including self-harm):  No  Current HI:  No  Current AVH: No  Staff Intervention Plan: Group Attendance, Collaborate with Interdisciplinary Treatment Team  Consent to Intern Participation: N/A     Caroll RancherMarjette Eissa Buchberger, LRT/CTRS  Caroll RancherLindsay, Jatziri Goffredo A 07/30/2017, 3:56 PM

## 2017-07-30 NOTE — BHH Group Notes (Signed)
LCSW Group Therapy Note   07/30/2017 1:15pm   Type of Therapy and Topic:  Group Therapy:  Overcoming Obstacles   Participation Level:  Minimal   Description of Group:    In this group patients will be encouraged to explore what they see as obstacles to their own wellness and recovery. They will be guided to discuss their thoughts, feelings, and behaviors related to these obstacles. The group will process together ways to cope with barriers, with attention given to specific choices patients can make. Each patient will be challenged to identify changes they are motivated to make in order to overcome their obstacles. This group will be process-oriented, with patients participating in exploration of their own experiences as well as giving and receiving support and challenge from other group members.   Therapeutic Goals: 1. Patient will identify personal and current obstacles as they relate to admission. 2. Patient will identify barriers that currently interfere with their wellness or overcoming obstacles.  3. Patient will identify feelings, thought process and behaviors related to these barriers. 4. Patient will identify two changes they are willing to make to overcome these obstacles:      Summary of Patient Progress  Stayed the entire time, engaged throughout.  "I am waiting for my disability settlement and its hard to be patient." Talked about how she is not able to work due to seizures, but how she is focued on taking care of herself to stay occupied.  Limited insight.   Therapeutic Modalities:   Cognitive Behavioral Therapy Solution Focused Therapy Motivational Interviewing Relapse Prevention Therapy  Ida RogueRodney B Deneka Greenwalt, LCSW 07/30/2017 3:41 PM

## 2017-07-30 NOTE — H&P (Signed)
Psychiatric Admission Assessment Adult  Patient Identification: Kathleen Davis MRN:  [id number] Date of Evaluation:  07/30/2017 Chief Complaint:  Paranoia, delusions, AH Principal Diagnosis: Acute psychosis ([place]) Diagnosis:   Patient Active Problem List   Diagnosis Date Noted  . Psychosis ([place]) [F29] 07/29/2017  . Acute psychosis ([place]) [F23] 07/29/2017  . SEIZURE DISORDER [R56.9] 04/02/2006   History of Present Illness:   Kathleen Davis is a 52 y/o F with no previous psychiatric history who was admitted from [place] on IVC placed in ED after she was brought in by EMS whom she called reporting concern that her neighbors were monitoring her/commenting on her seizures. She also reported history of minimal sleep for the past 9-10 days. Pt had called police/EMS at least twice before to investigate her neighbors, but each time they did not hear pt's neighbors when they arrived. Pt was medically cleared. She was started on trial of risperdal in addition to being restarted on home medications for seizure disorder. She was transferred to [facility] for additional treatment and stabilization.  Upon initial presentation, pt has poor insight about her reasons for admission. When asked why she came to the hospital, pt shares, "I would say I'm a little depressed; my son has not been responsible so that has me a little depressed." When asked directly about EMS coming to her apartment, she explains, "They said I should be checked out to be sure I have my medications." Pt does states, "I have not been sleeping properly, I keep waking up at night." When asked directly about her neighbors, pt states, "I really don't know - they stay up a lot, and I asked them to keep it down, and they said they would try." Pt was asked about previous statement that her neighbors have equipment to monitor her, and pt replies, "I'm not sure if they had equipment, but they were talking about whether or not I was having a seizure or something. They may  have known my son." Pt denies other concerns prior to coming to the hospital. She reports mildly depressed mood but otherwise denies symptoms of depression, mania, OCD, and PTSD. She denies all illicit substance use, but her UDS was positive for THC.  Discussed with patient about treatment options. She reports that she is tolerating her medications without difficulty or side effects, and she is in agreement to continue on risperdal. She is in agreement to allow treatment team to contact her youngest daughter, Dan Europe. Pt was in agreement with the above plan, and she had no further questions, comments, or concerns.  Associated Signs/Symptoms: Depression Symptoms:  depressed mood, insomnia, anxiety, disturbed sleep, (Hypo) Manic Symptoms:  Delusions, Hallucinations, Anxiety Symptoms:  Excessive Worry, Psychotic Symptoms:  Hallucinations: Auditory PTSD Symptoms: NA Total Time spent with patient: 1 hour  Past Psychiatric History:  -No previous diagnoses - No previous inpt/outpt treatment - No previous suicide attempt/self-injurious behavior  Is the patient at risk to self? Yes.    Has the patient been a risk to self in the past 6 months? No.  Has the patient been a risk to self within the distant past? No.  Is the patient a risk to others? No.  Has the patient been a risk to others in the past 6 months? No.  Has the patient been a risk to others within the distant past? No.   Prior Inpatient Therapy:   Prior Outpatient Therapy:    Alcohol Screening: 1. How often do you have a drink containing alcohol?: Never 2. How  many drinks containing alcohol do you have on a typical day when you are drinking?: 1 or 2 3. How often do you have six or more drinks on one occasion?: Never AUDIT-C Score: 0 4. How often during the last year have you found that you were not able to stop drinking once you had started?: Never 5. How often during the last year have you failed to do what was normally  expected from you becasue of drinking?: Never 6. How often during the last year have you needed a first drink in the morning to get yourself going after a heavy drinking session?: Never 7. How often during the last year have you had a feeling of guilt of remorse after drinking?: Never 8. How often during the last year have you been unable to remember what happened the night before because you had been drinking?: Never 9. Have you or someone else been injured as a result of your drinking?: No 10. Has a relative or friend or a doctor or another health worker been concerned about your drinking or suggested you cut down?: No Alcohol Use Disorder Identification Test Final Score (AUDIT): 0 Intervention/Follow-up: AUDIT Score <7 follow-up not indicated Substance Abuse History in the last 12 months:  Yes.   Consequences of Substance Abuse: Medical Consequences:  worsened psychosis Previous Psychotropic Medications: No  Psychological Evaluations: No  Past Medical History:  Past Medical History:  Diagnosis Date  . Chronic headaches   . Depression   . Hypertension   . Seizures ([place])    History reviewed. No pertinent surgical history. Family History:  Family History  Problem Relation Age of Onset  . Hypertension Mother   . Diabetes Mother   . Stroke Mother   . Hypertension Father   . Cancer Father   . Seizures Father   . Hypertension Sister   . Diabetes Sister   . Hypertension Brother   . Diabetes Brother    Family Psychiatric  History: denies family psychiatric history Tobacco Screening: Have you used any form of tobacco in the last 30 days? (Cigarettes, Smokeless Tobacco, Cigars, and/or Pipes): No Social History: Pt was born in [place]. She has lived in [place] since 1998. She lives by herself in an apartment for the past 1 month, and prior to that she was staying in a shelter. 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 Her husband passed  away and she has 4 adult children. She denies legal and trauma history. Social History   Substance and Sexual Activity  Alcohol Use Never  . Frequency: Never     Social History   Substance and Sexual Activity  Drug Use Never    Additional Social History: Marital status: Single Are you sexually active?: No What is your sexual orientation?: heterosexual Has your sexual activity been affected by drugs, alcohol, medication, or emotional stress?: n/a  Does patient have children?: Yes How many children?: 4 How is patient's relationship with their children?: 28, 37, 34, and 30. My oldest son lives here and I moved here to be close to him. 3 girls live in [place]. "good relationship with them."                          Allergies:  No Known Allergies Lab Results:  Results for orders placed or performed during the hospital encounter of 07/28/17 (from the past 48 hour(s))  Comprehensive metabolic panel  Status: Abnormal   Collection Time: 07/28/17 10:51 PM  Result Value Ref Range   Sodium 142 135 - 145 mmol/L   Potassium 3.5 3.5 - 5.1 mmol/L   Chloride 110 101 - 111 mmol/L   CO2 25 22 - 32 mmol/L   Glucose, Bld 93 65 - 99 mg/dL   BUN 16 6 - 20 mg/dL   Creatinine, Ser 1.06 (H) 0.44 - 1.00 mg/dL   Calcium 9.1 8.9 - 10.3 mg/dL   Total Protein 7.5 6.5 - 8.1 g/dL   Albumin 3.9 3.5 - 5.0 g/dL   AST 20 15 - 41 U/L   ALT 12 (L) 14 - 54 U/L   Alkaline Phosphatase 116 38 - 126 U/L   Total Bilirubin 0.6 0.3 - 1.2 mg/dL   GFR calc non Af Amer 60 (L) >60 mL/min   GFR calc Af Amer >60 >60 mL/min    Comment: (NOTE) The eGFR has been calculated using the CKD EPI equation. This calculation has not been validated in all clinical situations. eGFR's persistently <60 mL/min signify possible Chronic Kidney Disease.    Anion gap 7 5 - 15    Comment: Performed at [facility], [place] [address], [place], [place] [address]  Ethanol     Status: None   Collection Time: 07/28/17  10:51 PM  Result Value Ref Range   Alcohol, Ethyl (B) <10 <10 mg/dL    Comment: (NOTE) Lowest detectable limit for serum alcohol is 10 mg/dL. For medical purposes only. Performed at [facility], [place] [address], [place], [place] [address]   cbc     Status: Abnormal   Collection Time: 07/28/17 10:51 PM  Result Value Ref Range   WBC 5.3 4.0 - 10.5 K/uL   RBC 3.87 3.87 - 5.11 MIL/uL   Hemoglobin 11.6 (L) 12.0 - 15.0 g/dL   HCT 36.6 36.0 - 46.0 %   MCV 94.6 78.0 - 100.0 fL   MCH 30.0 26.0 - 34.0 pg   MCHC 31.7 30.0 - 36.0 g/dL   RDW 13.6 11.5 - 15.5 %   Platelets 305 150 - 400 K/uL    Comment: Performed at [facility], [place] [address], [place], [place] [address]  Rapid urine drug screen (hospital performed)     Status: Abnormal   Collection Time: 07/28/17 10:52 PM  Result Value Ref Range   Opiates NONE DETECTED NONE DETECTED   Cocaine NONE DETECTED NONE DETECTED   Benzodiazepines NONE DETECTED NONE DETECTED   Amphetamines NONE DETECTED NONE DETECTED   Tetrahydrocannabinol POSITIVE (A) NONE DETECTED   Barbiturates NONE DETECTED NONE DETECTED    Comment: (NOTE) DRUG SCREEN FOR MEDICAL PURPOSES ONLY.  IF CONFIRMATION IS NEEDED FOR ANY PURPOSE, NOTIFY LAB WITHIN 5 DAYS. LOWEST DETECTABLE LIMITS FOR URINE DRUG SCREEN Drug Class                     Cutoff (ng/mL) Amphetamine and metabolites    1000 Barbiturate and metabolites    200 Benzodiazepine                 888 Tricyclics and metabolites     300 Opiates and metabolites        300 Cocaine and metabolites        300 THC                            50 Performed at [facility]  [place] [address], [place], [place] [address]   I-Stat beta hCG blood, ED     Status: None   Collection Time: 07/28/17 11:06 PM  Result Value Ref Range   I-stat hCG, quantitative <5.0 <5 mIU/mL   Comment 3            Comment:   GEST. AGE      CONC.  (mIU/mL)   <=1 WEEK        5 - 50     2  WEEKS       50 - 500     3 WEEKS       100 - 10,000     4 WEEKS     1,000 - 30,000        FEMALE AND NON-PREGNANT FEMALE:     LESS THAN 5 mIU/mL     Blood Alcohol level:  Lab Results  Component Value Date   ETH <10 15/72/6203    Metabolic Disorder Labs:  No results found for: HGBA1C, MPG No results found for: PROLACTIN Lab Results  Component Value Date   CHOL 200 02/24/2007   TRIG 121 02/24/2007   HDL 53 02/24/2007   CHOLHDL 3.8 Ratio 02/24/2007   VLDL 24 02/24/2007   LDLCALC 123 (H) 02/24/2007    Current Medications: Current Facility-Administered Medications  Medication Dose Route Frequency Provider Last Rate Last Dose  . acetaminophen (TYLENOL) tablet 650 mg  650 mg Oral Q6H PRN Faythe Dingwall, DO      . alum & mag hydroxide-simeth (MAALOX/MYLANTA) 200-200-20 MG/5ML suspension 30 mL  30 mL Oral Q6H PRN Faythe Dingwall, DO      . hydrOXYzine (ATARAX/VISTARIL) tablet 25 mg  25 mg Oral Q6H PRN Lindon Romp A, NP   25 mg at 07/29/17 2204  . lamoTRIgine (LAMICTAL) tablet 100 mg  100 mg Oral BID Faythe Dingwall, DO   100 mg at 07/30/17 0756  . levETIRAcetam (KEPPRA) tablet 1,000 mg  1,000 mg Oral TID Faythe Dingwall, DO   1,000 mg at 07/30/17 1217  . magnesium hydroxide (MILK OF MAGNESIA) suspension 30 mL  30 mL Oral Daily PRN Faythe Dingwall, DO      . ondansetron [facility]) tablet 4 mg  4 mg Oral Q8H PRN Faythe Dingwall, DO      . risperiDONE (RISPERDAL) tablet 1 mg  1 mg Oral QHS Pennelope Bracken, MD      . traZODone (DESYREL) tablet 50 mg  50 mg Oral QHS PRN,MR X 1 Lindon Romp A, NP   50 mg at 07/29/17 2204   PTA Medications: Medications Prior to Admission  Medication Sig Dispense Refill Last Dose  . lamoTRIgine (LAMICTAL) 100 MG tablet Take 1 tablet (100 mg total) by mouth 2 (two) times daily. 60 tablet 3 07/28/2017 at 2130  . levETIRAcetam (KEPPRA) 1000 MG tablet Take 1 tablet (1,000 mg total) by mouth 3 (three) times daily. 90 tablet  3 07/28/2017 at 2130  . ondansetron (ZOFRAN) 4 MG tablet Take 1 tablet (4 mg total) by mouth every 8 (eight) hours as needed for nausea or vomiting. 12 tablet 1 07/28/2017 at Unknown time    Musculoskeletal: Strength & Muscle Tone: within normal limits Gait & Station: normal Patient leans: N/A  Psychiatric Specialty Exam: Physical Exam  Nursing note and vitals reviewed.   Review of Systems  Constitutional: Negative for chills and fever.  Respiratory: Negative for cough and shortness of breath.  Cardiovascular: Negative for chest pain.  Gastrointestinal: Negative for abdominal pain, heartburn, nausea and vomiting.  Psychiatric/Behavioral: Positive for depression and hallucinations. Negative for suicidal ideas. The patient has insomnia. The patient is not nervous/anxious.     Blood pressure 126/79, pulse 90, temperature 98.7 F (37.1 C), temperature source Oral, resp. rate 18, height 5' 5"  (1.651 m), weight 103 kg (227 lb), last menstrual period 06/13/2011.Body mass index is 37.77 kg/m.  General Appearance: Casual and Fairly Groomed  Eye Contact:  Good  Speech:  Clear and Coherent and Normal Rate  Volume:  Normal  Mood:  Euthymic  Affect:  Congruent and Constricted  Thought Process:  Coherent and Goal Directed  Orientation:  Full (Time, Place, and Person)  Thought Content:  Delusions and Hallucinations: Auditory  Suicidal Thoughts:  No  Homicidal Thoughts:  No  Memory:  Immediate;   Fair Recent;   Fair Remote;   Fair  Judgement:  Poor  Insight:  Lacking  Psychomotor Activity:  Normal  Concentration:  Concentration: Fair  Recall:  [employer] of Knowledge:  Fair  Language:  Fair  Akathisia:  No  Handed:    AIMS (if indicated):     Assets:  Resilience Social Support  ADL's:  Intact  Cognition:  WNL  Sleep:  Number of Hours: 6.75   Treatment Plan Summary: Daily contact with patient to assess and evaluate symptoms and progress in treatment and Medication  management  Observation Level/Precautions:  15 minute checks  Laboratory:  CBC Chemistry Profile HbAIC UDS UA  Psychotherapy:  Encourage participation in groups and therapeutic milieu   Medications:  Continue risperdal 66m po qhs. Continue vistaril 27mpo q6h prn anxiety. Continue lamictal 10031mo BID. Continue keppra 1000m62m TID. Continue trazodone 50mg17mqhs prn insomnia (may repeat x1)  Consultations:    Discharge Concerns:    Estimated LOS: 3-5 days  Other:     Physician Treatment Plan for Primary Diagnosis: Acute psychosis (HCC) [place] Term Goal(s): Improvement in symptoms so as ready for discharge  Short Term Goals: Ability to identify and develop effective coping behaviors will improve  Physician Treatment Plan for Secondary Diagnosis: Principal Problem:   Acute psychosis (HCC) [place] Term Goal(s): Improvement in symptoms so as ready for discharge  Short Term Goals: Ability to maintain clinical measurements within normal limits will improve  I certify that inpatient services furnished can reasonably be expected to improve the patient's condition.    ChrisPennelope Bracken6/12/20193:37 PM

## 2017-07-30 NOTE — Progress Notes (Signed)
Recreation Therapy Notes  Date: 6.12.19 Time: 1000 Location: 500 Hall Dayroom  Group Topic: Communication, Team Building, Problem Solving  Goal Area(s) Addresses:  Patient will effectively work with peer towards shared goal.  Patient will identify skill used to make activity successful.  Patient will identify how skills used during activity can be used to reach post d/c goals.   Behavioral Response: Engaged  Intervention: STEM Activity   Activity: In team's, using 20 plastic straws and a long piece of masking tape, patients were to construct a stand alone bridge that could hold the weight of a small puzzle box.   Education: Pharmacist, communityocial Skills, Building control surveyorDischarge Planning.   Education Outcome: Acknowledges education/In group clarification offered/Needs additional education.   Clinical Observations/Feedback: Pt was engaged and worked well with her peers.  Pt left to meet with doctor but later returned.    Caroll RancherMarjette Jizelle Davis, LRT/CTRS      Caroll RancherLindsay, Dresean Beckel A 07/30/2017 1:07 PM

## 2017-07-30 NOTE — BHH Counselor (Signed)
Adult Comprehensive Assessment  Patient ID: Kathleen Davis, female   DOB: 03-Mar-1965, 52 y.o.   MRN: [id number]  Information Source: Information source: Patient  Current Stressors:  Physical health (include injuries & life threatening diseases): epilepsy and seizures; blood pressure issues at times.  Bereavement / Loss: none recenlty.   Living/Environment/Situation:  Living Arrangements: Alone Living conditions (as described by patient or guardian): apartment; "I like it there."  Who else lives in the home?: alone How long has patient lived in current situation?: 3 years in [place]. originally from [place].  What is atmosphere in current home: Comfortable  Family History:  Marital status: Single Are you sexually active?: No What is your sexual orientation?: heterosexual Has your sexual activity been affected by drugs, alcohol, medication, or emotional stress?: n/a  Does patient have children?: Yes How many children?: 4 How is patient's relationship with their children?: 28, 37, 34, and 30. My oldest son lives here and I moved here to be close to him. 3 girls live in [place]. "good relationship with them."   Childhood History:  By whom was/is the patient raised?: Mother/father and step-parent Additional childhood history information: mother was primary caretaker; parents divorced when pt was 3. Description of patient's relationship with caregiver when they were a child: close to mom; close to dad "I wish we could see him more. My mom stayed angry at him."  Patient's description of current relationship with people who raised him/her: mother passed away 2 years ago. dad-still living in [place]. "I don't see him." "I talk to him every now and then." How were you disciplined when you got in trouble as a child/adolescent?: "They would tear our tails up." "No abuse."  Does patient have siblings?: Yes Number of Siblings: 4 Description of patient's current relationship with siblings: 3 sisters and 1 brother.  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  Currently a student?: No Learning disability?: No  Employment/Work Situation:   Employment situation: Unemployed Patient's job has been impacted by current illness: Yes Describe how patient's job has been impacted: "I'm not sure. It's hard for me to handle work. 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?: No(n/a) Are There Guns or Other Weapons in Your Home?: (n/a) Are These Comptroller?: (n/a)  Financial Resources:   Financial resources: Food stamps(section 8 housing) Does patient have a Lawyer or guardian?: No  Alcohol/Substance Abuse:   What has been your use of drugs/alcohol within the last 12 months?: denies drug/alcohol abuse.  If attempted suicide, did drugs/alcohol play a role in this?: No If yes, describe treatment: pt denies.  Has alcohol/substance abuse ever caused legal problems?: No  Social Support System:   Patient's Community Support System: Fair Describe Community Support System: few close friends; don't see them that often anymore. My son."  Type of faith/religion: "none at this time." How does  patient's faith help to cope with current illness?: n/a   Leisure/Recreation:   Leisure and Hobbies: play tennis; run; playing spades  Strengths/Needs:   What is  the patient's perception of their strengths?: "i'm good with business; I'm smart." Patient states they can use these personal strengths during their treatment to contribute to their recovery: "Not sure." Patient states these barriers may affect/interfere with their treatment: "not sure."  Patient states these barriers may affect their return to the community: declined to anser Other important information patient would like considered in planning for their treatment: "No that was it."   Discharge Plan:   Currently receiving community mental health services: No Patient states concerns and preferences for aftercare planning are: limited income; no insurance Patient states they will know when they are safe and ready for discharge when: "I feel better now." Does patient have access to transportation?: Yes(bus or get rides) Does patient have financial barriers related to discharge medications?: Yes Patient description of barriers related to discharge medications: in the process of applying for disability; no income; no insurance currently.  Will patient be returning to same living situation after discharge?: Yes  Summary/Recommendations:   Summary and Recommendations (to be completed by the evaluator): Patient [place] 52yo [place] [facility] She presents to the hospital due to insomnia, possible delusions, panic/anxiety, and passive SI. Patient denies SI/HI/AVH and states the issues with her neighbors has been resolved. Patient has a primary diagnosis of Generalized Anxiety Disorder. [facility] Patient has seizures due to epilepsy and sees MD/PCP for this. She is not interested in referral for psychiatric outpatient treatment at this time but was agreeable to signing release and being provided with walk in information. Patient plans to return home to her apt at discharge. Recommendations for patient include: crisis stabilization, therapeutic milieu,  encourage group attendande and participation, medication management for mood stabilization, and development of comprehensive mental wellness plan. CSW assessing.   Rona RavensHeather S Arden Axon LCSW 07/30/2017 10:35 AM

## 2017-07-30 NOTE — Progress Notes (Signed)
Patient denies SI, HI and AVH.  Patient has been pacing the halls, attending groups and engaged in unit activities.  Patient still believes that her neighbors are listening to her through devices and causing her to have seizures.   Assess patient for safety, offer medications as prescribed, engage patient in 1:1 staff talks.   Patient able to contract for safety.  Continue to monitor as planned.   

## 2017-07-30 NOTE — Plan of Care (Signed)
D: Pt denies SI/HI/AVH. Pt is pleasant and cooperative. Pt presents a little bizarre at times. Pt appears to be responding at times, but pt stated she has things on her mind. Pt forwarded little information at this time. Pt was seen in the dayroom much of the evening.  A: Pt was offered support and encouragement. Pt was given scheduled medications. Pt was encourage to attend groups. Q 15 minute checks were done for safety.    R:Pt attends groups and interacts well with peers and staff. Pt is taking medication. Pt has no complaints.Pt receptive to treatment and safety maintained on unit.   Problem: Education: Goal: Emotional status will improve Outcome: Progressing   Problem: Education: Goal: Mental status will improve Outcome: Progressing   Problem: Education: Goal: Verbalization of understanding the information provided will improve Outcome: Progressing   Problem: Activity: Goal: Interest or engagement in activities will improve Outcome: Progressing   Problem: Activity: Goal: Sleeping patterns will improve Outcome: Progressing   Problem: Coping: Goal: Ability to demonstrate self-control will improve Outcome: Progressing   Problem: Safety: Goal: Periods of time without injury will increase Outcome: Progressing   Problem: Activity: Goal: Will verbalize the importance of balancing activity with adequate rest periods Outcome: Progressing   Problem: Education: Goal: Will be free of psychotic symptoms Outcome: Progressing

## 2017-07-30 NOTE — [facility] Suicide Risk Assessment (Signed)
[facility] INPATIENT:  Family/Significant Other Suicide Prevention Education  Suicide Prevention Education:  Contact Attempts: Melynda Rippleatiana Matthews (pt's daughter) [phone] has been identified by the patient as the family member/significant other with whom the patient will be residing, and identified as the person(s) who will aid the patient in the event of a mental health crisis.  With written consent from the patient, two attempts were made to provide suicide prevention education, prior to and/or following the patient's discharge.  We were unsuccessful in providing suicide prevention education.  A suicide education pamphlet was given to the patient to share with family/significant other.  Date and time of first attempt: 07/30/17 at 11:07AM unable to leave voicemail.  Date and time of second attempt: 07/30/17 at 2:09PM  Rona RavensHeather S Mohsin Crum LCSW 07/30/2017, 2:09 PM

## 2017-07-30 NOTE — [facility] Suicide Risk Assessment (Signed)
[facility]   Nursing information obtained from:  Patient Demographic factors:  Low socioeconomic status, Living alone Current Mental Status:  NA Loss Factors:  NA Historical Factors:  NA Risk Reduction Factors:  Positive social support, Positive therapeutic relationship, Positive coping skills or problem solving skills  Total Time spent with patient: 1 hour Principal Problem: Acute psychosis (HCC) Diagnosis:   Patient Active Problem List   Diagnosis Date Noted  . Psychosis (HCC) [F29] 07/29/2017  . Acute psychosis (HCC) [F23] 07/29/2017  . SEIZURE DISORDER [R56.9] 04/02/2006   Subjective Data: See H&P for details  Continued Clinical Symptoms:  Alcohol Use Disorder Identification Test Final Score (AUDIT): 0 The "Alcohol Use Disorders Identification Test", Guidelines for Use in Primary Care, Second Edition.  World Science writerHealth Organization [facility](WHO). Score between 0-7:  no or low risk or alcohol related problems. Score between 8-15:  moderate risk of alcohol related problems. Score between 16-19:  high risk of alcohol related problems. Score 20 or above:  warrants further diagnostic evaluation for alcohol dependence and treatment.   CLINICAL FACTORS:   Severe Anxiety and/or Agitation Epilepsy Currently Psychotic Unstable or Poor Therapeutic Relationship Medical Diagnoses and Treatments/Surgeries  Psychiatric Specialty Exam: Physical Exam  Nursing note and vitals reviewed.   ROS- See H&P for details  Blood pressure 126/79, pulse 90, temperature 98.7 F (37.1 C), temperature source Oral, resp. rate 18, height 5\' 5"  (1.651 m), weight 103 kg (227 lb), last menstrual period 06/13/2011.Body mass index is 37.77 kg/m.   COGNITIVE FEATURES THAT CONTRIBUTE TO RISK:  None    SUICIDE RISK:   Minimal: No identifiable suicidal ideation.  Patients presenting with no risk factors but with morbid ruminations; may be classified as minimal risk based on the severity of  the depressive symptoms  PLAN OF CARE: See H&P for details  I certify that inpatient services furnished can reasonably be expected to improve the patient's condition.   Micheal Likenshristopher T Bisma Klett, MD 07/30/2017, 3:55 PM

## 2017-07-30 NOTE — Plan of Care (Signed)
D: Pt denies SI/HI/AVH. Pt is pleasant and cooperative. Pt presents bizarre at times . Pt visible on the unit this evening for a little while.     A: Pt was offered support and encouragement. Pt was given scheduled medications. Pt was encourage to attend groups. Q 15 minute checks were done for safety.   R:Pt attends groups and interacts well with peers and staff. Pt is taking medication. Pt receptive to treatment and safety maintained on unit.   Problem: Safety: Goal: Ability to remain free from injury will improve Outcome: Progressing

## 2017-07-31 ENCOUNTER — Ambulatory Visit (HOSPITAL_COMMUNITY)
Admission: AD | Admit: 2017-07-31 | Discharge: 2017-07-31 | Disposition: A | Discharge status: Home / Self Care | Payer: Medicare Other | Source: Intra-hospital | Attending: Psychiatry | Admitting: Psychiatry

## 2017-07-31 MED ORDER — RISPERIDONE 2 MG PO TABS
2.0000 mg | ORAL_TABLET | Freq: Every day | ORAL | Status: DC
  Administered 2017-07-31: 2 mg via ORAL
  Filled 2017-07-31: qty 7
  Filled 2017-07-31 (×2): qty 1

## 2017-07-31 NOTE — Progress Notes (Signed)
Recreation Therapy Notes  Date: 6.13.19 Time: 1000 Location: 500 Hall Dayroom  Group Topic: Coping Skills  Goal Area(s) Addresses:  Patient will be able to identify positive coping skills. Patient will be able to identify benefits of using coping skills post d/c.  Behavioral Response: Engaged  Intervention: Mind map    Activity: Mind map.  LRT and patients filled in the first eight boxes of the mind map (job, pain, impulses, depression, transportation, medication, anger/aggression and family) together.  Patients then identified coping skills for each situation individually before reconvening as a group.  Education: PharmacologistCoping Skills, Building control surveyorDischarge Planning.   Education Outcome: Acknowledges understanding/In group clarification offered/Needs additional education.   Clinical Observations/Feedback: Pt was active and engaged in group.  Pt identified some of her coping skills as make eye contact, dress proper, be on time for job; cook a new meal for impulses; medication for depression; and cookouts for family.    Caroll RancherMarjette Theodore Rahrig, LRT/CTRS     Lillia AbedLindsay, Ezrael Sam A 07/31/2017 11:30 AM

## 2017-07-31 NOTE — Progress Notes (Signed)
Adult Psychoeducational Group Note  Date:  07/31/2017 Time:  8:58 PM  Group Topic/Focus:  Coping With Mental Health Crisis:   The purpose of this group is to help patients identify strategies for coping with mental health crisis.  Group discusses possible causes of crisis and ways to manage them effectively.  Participation Level:  Active  Participation Quality:  Sharing  Affect:  Appropriate  Cognitive:  Appropriate  Insight: Appropriate  Engagement in Group:  Engaged  Modes of Intervention:  Discussion and Education  Additional Comments:  N/A  Lyndee HensenGoins, Dyonna Jaspers R 07/31/2017, 8:58 PM

## 2017-07-31 NOTE — Progress Notes (Signed)
Patient denies SI, HI and AVH.  Patient has been pacing the halls, attending groups and engaged in unit activities.  Patient still believes that her neighbors are listening to her through devices and causing her to have seizures.   Assess patient for safety, offer medications as prescribed, engage patient in 1:1 staff talks.   Patient able to contract for safety.  Continue to monitor as planned.

## 2017-07-31 NOTE — Tx Team (Signed)
Interdisciplinary Treatment and Diagnostic Plan Update  07/31/2017 Time of Session: 1:23 PM  Sharonna Vinje MRN: [id number]  Principal Diagnosis: Acute psychosis [facility])  Secondary Diagnoses: Principal Problem:   Acute psychosis ([place])   Current Medications:  Current Facility-Administered Medications  Medication Dose Route Frequency Provider Last Rate Last Dose  . acetaminophen (TYLENOL) tablet 650 mg  650 mg Oral Q6H PRN Faythe Dingwall, DO      . alum & mag hydroxide-simeth (MAALOX/MYLANTA) 200-200-20 MG/5ML suspension 30 mL  30 mL Oral Q6H PRN Faythe Dingwall, DO      . hydrOXYzine (ATARAX/VISTARIL) tablet 25 mg  25 mg Oral Q6H PRN Lindon Romp A, NP   25 mg at 07/30/17 2114  . lamoTRIgine (LAMICTAL) tablet 100 mg  100 mg Oral BID Buford Dresser J, DO   100 mg at 07/31/17 0730  . levETIRAcetam (KEPPRA) tablet 1,000 mg  1,000 mg Oral TID Buford Dresser J, DO   1,000 mg at 07/31/17 1246  . magnesium hydroxide (MILK OF MAGNESIA) suspension 30 mL  30 mL Oral Daily PRN Faythe Dingwall, DO      . ondansetron [facility]) tablet 4 mg  4 mg Oral Q8H PRN Faythe Dingwall, DO      . risperiDONE (RISPERDAL) tablet 2 mg  2 mg Oral QHS Pennelope Bracken, MD      . traZODone (DESYREL) tablet 50 mg  50 mg Oral QHS PRN,MR X 1 Lindon Romp A, NP   50 mg at 07/30/17 2114    PTA Medications: Medications Prior to Admission  Medication Sig Dispense Refill Last Dose  . lamoTRIgine (LAMICTAL) 100 MG tablet Take 1 tablet (100 mg total) by mouth 2 (two) times daily. 60 tablet 3 07/28/2017 at 2130  . levETIRAcetam (KEPPRA) 1000 MG tablet Take 1 tablet (1,000 mg total) by mouth 3 (three) times daily. 90 tablet 3 07/28/2017 at 2130  . ondansetron (ZOFRAN) 4 MG tablet Take 1 tablet (4 mg total) by mouth every 8 (eight) hours as needed for nausea or vomiting. 12 tablet 1 07/28/2017 at Unknown time    Patient Stressors: Medication change or noncompliance Other: conflict with  neighbors  Patient Strengths: Ability for insight Average or above average intelligence Capable of independent living General fund of knowledge  Treatment Modalities: Medication Management, Group therapy, Case management,  1 to 1 session with clinician, Psychoeducation, Recreational therapy.   Physician Treatment Plan for Primary Diagnosis: Acute psychosis ([place]) Long Term Goal(s): Improvement in symptoms so as ready for discharge  Short Term Goals: Ability to identify and develop effective coping behaviors will improve Ability to maintain clinical measurements within normal limits will improve  Medication Management: Evaluate patient's response, side effects, and tolerance of medication regimen.  Therapeutic Interventions: 1 to 1 sessions, Unit Group sessions and Medication administration.  Evaluation of Outcomes: Progressing  Physician Treatment Plan for Secondary Diagnosis: Principal Problem:   Acute psychosis ([place])   Long Term Goal(s): Improvement in symptoms so as ready for discharge  Short Term Goals: Ability to identify and develop effective coping behaviors will improve Ability to maintain clinical measurements within normal limits will improve  Medication Management: Evaluate patient's response, side effects, and tolerance of medication regimen.  Therapeutic Interventions: 1 to 1 sessions, Unit Group sessions and Medication administration.  Evaluation of Outcomes: Progressing   RN Treatment Plan for Primary Diagnosis: Acute psychosis ([place]) Long Term Goal(s): Knowledge of disease and therapeutic regimen to maintain health will improve  Short Term Goals: Ability to  identify and develop effective coping behaviors will improve and Compliance with prescribed medications will improve  Medication Management: RN will administer medications as ordered by provider, will assess and evaluate patient's response and provide education to patient for prescribed medication. RN will  report any adverse and/or side effects to prescribing provider.  Therapeutic Interventions: 1 on 1 counseling sessions, Psychoeducation, Medication administration, Evaluate responses to treatment, Monitor vital signs and CBGs as ordered, Perform/monitor CIWA, COWS, AIMS and Fall Risk screenings as ordered, Perform wound care treatments as ordered.  Evaluation of Outcomes: Progressing   LCSW Treatment Plan for Primary Diagnosis: Acute psychosis ([place]) Long Term Goal(s): Safe transition to appropriate next level of care at discharge, Engage patient in therapeutic group addressing interpersonal concerns.  Short Term Goals: Engage patient in aftercare planning with referrals and resources  Therapeutic Interventions: Assess for all discharge needs, 1 to 1 time with Social worker, Explore available resources and support systems, Assess for adequacy in community support network, Educate family and significant other(s) on suicide prevention, Complete Psychosocial Assessment, Interpersonal group therapy.  Evaluation of Outcomes: Met Return home, follow up outpt   Progress in Treatment: Attending groups: Yes Participating in groups: Yes Taking medication as prescribed: Yes Toleration medication: Yes, no side effects reported at this time Family/Significant other contact made: No Patient understands diagnosis: No Limited insight Discussing patient identified problems/goals with staff: Yes Medical problems stabilized or resolved: Yes Denies suicidal/homicidal ideation: Yes Issues/concerns per patient self-inventory: None Other: N/A  New problem(s) identified: None identified at this time.   New Short Term/Long Term Goal(s): "I was having some pain, but I am fine now."   Discharge Plan or Barriers:   Reason for Continuation of Hospitalization: Delusions  Medication stabilization   Estimated Length of Stay: 6/17  Attendees: Patient: Kathleen Davis 07/31/2017  1:23 PM  Physician: Maris Berger, MD 07/31/2017  1:23 PM  Nursing: Sena Hitch, RN 07/31/2017  1:23 PM  RN Care Manager: Lars Pinks, RN 07/31/2017  1:23 PM  Social Worker: Ripley Fraise 07/31/2017  1:23 PM  Recreational Therapist: Winfield Cunas 07/31/2017  1:23 PM  Other: Norberto Sorenson 07/31/2017  1:23 PM  Other:  07/31/2017  1:23 PM    Scribe for Treatment Team:  Roque Lias LCSW 07/31/2017 1:23 PM

## 2017-07-31 NOTE — [facility] Group Notes (Signed)
[facility]  07/31/2017 , 1:22 PM    Type of Therapy:  [employer] Presentation  Participation Level:  Active  Participation Quality:  Attentive  Affect:  Blunted  Cognitive:  Oriented  Insight:  Limited  Engagement in Therapy:  Engaged  Modes of Intervention:  Discussion, Education and Socialization  Summary of Progress/Problems:  Tammi  from [employer] came to present her recovery story, encourage group  members to share something about their story, and present information about the MHA.  Stayed the entire time, engaged throughout. Declined to share when offered the opportunity at the end.  Daryel Geraldorth, Daniel Ritthaler B 07/31/2017 , 1:22 PM

## 2017-07-31 NOTE — Progress Notes (Signed)
[facility]  07/31/2017 11:20 AM Kathleen Davis  MRN:  [id number] Subjective:    Kathleen Davis is a 52 y/o F with no previous psychiatric history who was admitted from [facility] on IVC placed in ED after she was brought in by EMS whom she called reporting concern that her neighbors were monitoring her/commenting on her seizures. She also reported history of minimal sleep for the past 9-10 days. Pt had called police/EMS at least twice before to investigate her neighbors, but each time they did not hear pt's neighbors when they arrived. Pt was medically cleared. She was started on trial of risperdal in addition to being restarted on home medications for seizure disorder. [facility].  Today upon evaluation, pt shares, "I'm doing fine, but I felt like I had a couple of seizures last night back to back - I'm not sure if you restarted my keppra." Reviewed MAR with patient which showed she has been receiving all prescribed medications including keppra and lamictal for seizure control. RN staff did not observe and seizure-like activity from the patient. Pt was asked to describe her seizures and she states, "I start shaking in my hands and arms; I used to have grand mal's but it's not like those. They last about 5-10 minutes." Pt denies other physical complaints. She reports that her sleep was poor last night but RN staff recorded 6.75 hours of sleep. She denies SI/HI/AH/VH. She denies paranoia. She is tolerating her medications without difficulty or side effects, and she was in agreement to have dose of risperdal increased at bedtime.   Pt was asked directly about presenting symptoms of concerns about her neighbors monitoring her with equipment through the walls, and pt shares, "I've made a conscious decision about that; I'm not going to think about it, and I'm planning to move." Discussed with patient about her history and she denies previous similar  symptoms. Discussed with patient that new-onset of psychotic symptoms is uncommon in someone her age without prior history, and she would likely benefit from a CT scan to rule out intracranial abnormalities. Pt was in agreement to obtain a CT scan. Pt has outpatient neurologist, Dr. Marilu FavreShugar, for management of her seizures, and as per chart review her last brain imaging was in 2017 (see below).   As per chart review from pt's outpatient neurologist:  DIAGNOSTIC STUDIES:  [facility] MRI brain (09/19/2015): Not available for personal review. Reportedly showed scattered nonspecific white matter hyperintensities.   OSH CT head w/out contrast (09/11/2007): Not available for personal review. Reported as normal.   [facility] MRI brain wwo contrast (03/09/2005): Not available for personal review. "1.Asymmetrically small and hyperintense left hippocampal structure.This is suggestive of mesial temporal sclerosis.2.Stable benign dural calcifications near the vertex.There is normal opacification of the superior sagittal sinus through this area."  [facility] LTM (10/24/2015-10/26/2015): Reportedly captured left temporal lobe complex partial seizures, which clinically presented with staring, lip smacking, extending and stiffening R arm, and occasional progression to bilateral clonic-tonic activity.   Outpatient EEG (10/18/2011): Occasional F7 sharps.    Principal Problem: Acute psychosis (HCC) Diagnosis:   Patient Active Problem List   Diagnosis Date Noted  . Psychosis (HCC) [F29] 07/29/2017  . Acute psychosis (HCC) [F23] 07/29/2017  . SEIZURE DISORDER [R56.9] 04/02/2006   Total Time spent with patient: 30 minutes  Past Psychiatric History: see H&P  Past Medical History:  Past Medical History:  Diagnosis Date  . Chronic headaches   . Depression   .  Hypertension   . Seizures (HCC)    History reviewed. No pertinent surgical history. Family History:  Family History  Problem Relation Age of Onset  . Hypertension  Mother   . Diabetes Mother   . Stroke Mother   . Hypertension Father   . Cancer Father   . Seizures Father   . Hypertension Sister   . Diabetes Sister   . Hypertension Brother   . Diabetes Brother    Family Psychiatric  History: see H&P Social History:  Social History   Substance and Sexual Activity  Alcohol Use Never  . Frequency: Never     Social History   Substance and Sexual Activity  Drug Use Never    Social History   Socioeconomic History  . Marital status: Single    Spouse name: Not on file  . Number of children: Not on file  . Years of education: Not on file  . Highest education level: Not on file  Occupational History  . Not on file  Social Needs  . Financial resource strain: Not on file  . Food insecurity:    Worry: Not on file    Inability: Not on file  . Transportation needs:    Medical: Not on file    Non-medical: Not on file  Tobacco Use  . Smoking status: Former Smoker    Types: Cigarettes  . Smokeless tobacco: Never Used  . Tobacco comment: quit about 3 months ago  Substance and Sexual Activity  . Alcohol use: Never    Frequency: Never  . Drug use: Never  . Sexual activity: Yes  Lifestyle  . Physical activity:    Days per week: Not on file    Minutes per session: Not on file  . Stress: Not on file  Relationships  . Social connections:    Talks on phone: Not on file    Gets together: Not on file    Attends religious service: Not on file    Active member of club or organization: Not on file    Attends meetings of clubs or organizations: Not on file    Relationship status: Not on file  Other Topics Concern  . Not on file  Social History Narrative  . Not on file   Additional Social History:                         Sleep: Fair  Appetite:  Good  Current Medications: Current Facility-Administered Medications  Medication Dose Route Frequency Provider Last Rate Last Dose  . acetaminophen (TYLENOL) tablet 650 mg  650 mg  Oral Q6H PRN Cherly Beach, DO      . alum & mag hydroxide-simeth (MAALOX/MYLANTA) 200-200-20 MG/5ML suspension 30 mL  30 mL Oral Q6H PRN Cherly Beach, DO      . hydrOXYzine (ATARAX/VISTARIL) tablet 25 mg  25 mg Oral Q6H PRN Nira Conn A, NP   25 mg at 07/30/17 2114  . lamoTRIgine (LAMICTAL) tablet 100 mg  100 mg Oral BID Juanetta Beets J, DO   100 mg at 07/31/17 0730  . levETIRAcetam (KEPPRA) tablet 1,000 mg  1,000 mg Oral TID Juanetta Beets J, DO   1,000 mg at 07/31/17 0730  . magnesium hydroxide (MILK OF MAGNESIA) suspension 30 mL  30 mL Oral Daily PRN Cherly Beach, DO      . ondansetron [facility]) tablet 4 mg  4 mg Oral Q8H PRN Cherly Beach, DO      .  risperiDONE (RISPERDAL) tablet 1 mg  1 mg Oral QHS Micheal Likens, MD   1 mg at 07/30/17 2114  . traZODone (DESYREL) tablet 50 mg  50 mg Oral QHS PRN,MR X 1 Nira Conn A, NP   50 mg at 07/30/17 2114    Lab Results: No results found for this or any previous visit (from the past 48 hour(s)).  Blood Alcohol level:  Lab Results  Component Value Date   ETH <10 07/28/2017    Metabolic Disorder Labs: No results found for: HGBA1C, MPG No results found for: PROLACTIN Lab Results  Component Value Date   CHOL 200 02/24/2007   TRIG 121 02/24/2007   HDL 53 02/24/2007   CHOLHDL 3.8 Ratio 02/24/2007   VLDL 24 02/24/2007   LDLCALC 123 (H) 02/24/2007    Physical Findings: AIMS: Facial and Oral Movements Muscles of Facial Expression: None, normal Lips and Perioral Area: None, normal Jaw: None, normal Tongue: None, normal,Extremity Movements Upper (arms, wrists, hands, fingers): None, normal Lower (legs, knees, ankles, toes): None, normal, Trunk Movements Neck, shoulders, hips: None, normal, Overall Severity Severity of abnormal movements (highest score from questions above): None, normal Incapacitation due to abnormal movements: None, normal Patient's awareness of abnormal movements (rate  only patient's report): No Awareness, Dental Status Current problems with teeth and/or dentures?: No Does patient usually wear dentures?: No  CIWA:    COWS:     Musculoskeletal: Strength & Muscle Tone: within normal limits Gait & Station: normal Patient leans: N/A  Psychiatric Specialty Exam: Physical Exam  Nursing note and vitals reviewed.   Review of Systems  Constitutional: Negative for chills and fever.  Respiratory: Negative for cough and shortness of breath.   Cardiovascular: Negative for chest pain.  Gastrointestinal: Negative for abdominal pain, heartburn, nausea and vomiting.  Psychiatric/Behavioral: Negative for depression, hallucinations and suicidal ideas. The patient is not nervous/anxious and does not have insomnia.     Blood pressure 126/79, pulse 90, temperature 98.7 F (37.1 C), temperature source Oral, resp. rate 18, height 5\' 5"  (1.651 m), weight 103 kg (227 lb), last menstrual period 06/13/2011.Body mass index is 37.77 kg/m.  General Appearance: Casual and Fairly Groomed  Eye Contact:  Good  Speech:  Clear and Coherent and Normal Rate  Volume:  Normal  Mood:  Irritable  Affect:  Congruent and Constricted  Thought Process:  Coherent and Goal Directed  Orientation:  Full (Time, Place, and Person)  Thought Content:  Delusions and Paranoid Ideation  Suicidal Thoughts:  No  Homicidal Thoughts:  No  Memory:  Immediate;   Fair Recent;   Fair Remote;   Fair  Judgement:  Poor  Insight:  Lacking  Psychomotor Activity:  Normal  Concentration:  Concentration: Fair  Recall:  [employer] of Knowledge:  Fair  Language:  Fair  Akathisia:  No  Handed:    AIMS (if indicated):     Assets:  Resilience Social Support  ADL's:  Intact  Cognition:  WNL  Sleep:  Number of Hours: 6.75   Treatment Plan Summary: Daily contact with patient to assess and evaluate symptoms and progress in treatment and Medication management   -Continue inpatient  hospitalization  -Unspecified psychotic disorder   -Change risperdal 1mg  po qhs to risperdal 2mg  po qhs   -Obtain CT head without contrast  -Epilepsy  -Continue keppra 1000mg  po TID  -Continue lamictal 100mg  po BID  -Insomnia  -Continue trazodone 50mg  po qhs prn insomnia (may repeat x1)  -Anxiety  -Continue  vistaril 25mg  po q6h prn anxiety  -Encourage participation in groups and therapeutic milieu  -Disposition planning will be ongoing   Micheal Likens, MD 07/31/2017, 11:20 AM

## 2017-07-31 NOTE — [facility] Group Notes (Signed)
[facility] Group Notes:  (Nursing/MHT/Case Management/Adjunct)  Date:  07/31/2017  Time:  5:31 PM  Type of Therapy:  Psychoeducational Skills  Participation Level:  Active  Participation Quality:  Appropriate and Attentive  Affect:  Appropriate  Cognitive:  Alert and Appropriate  Insight:  Appropriate and Good  Engagement in Group:  Engaged  Modes of Intervention:  Activity  Summary of Progress/Problems: Patient was attentive and receptive.  [name] 07/31/2017, 5:31 PM

## 2017-08-01 DIAGNOSIS — F29 Unspecified psychosis not due to a substance or known physiological condition: Secondary | ICD-10-CM

## 2017-08-01 DIAGNOSIS — G4700 Insomnia, unspecified: Secondary | ICD-10-CM

## 2017-08-01 DIAGNOSIS — F419 Anxiety disorder, unspecified: Secondary | ICD-10-CM

## 2017-08-01 DIAGNOSIS — F17211 Nicotine dependence, cigarettes, in remission: Secondary | ICD-10-CM

## 2017-08-01 DIAGNOSIS — G40909 Epilepsy, unspecified, not intractable, without status epilepticus: Secondary | ICD-10-CM

## 2017-08-01 MED ORDER — RISPERIDONE 2 MG PO TABS: 2.0000 mg | ORAL_TABLET | Freq: Every day | ORAL | 0 refills | Status: DC

## 2017-08-01 MED ORDER — LAMOTRIGINE 100 MG PO TABS: 100.0000 mg | ORAL_TABLET | Freq: Two times a day (BID) | ORAL | 0 refills | Status: AC

## 2017-08-01 MED ORDER — HYDROXYZINE HCL 25 MG PO TABS: 25.0000 mg | ORAL_TABLET | Freq: Four times a day (QID) | ORAL | 0 refills | Status: DC | PRN

## 2017-08-01 MED ORDER — TRAZODONE HCL 50 MG PO TABS
50.0000 mg | ORAL_TABLET | Freq: Every evening | ORAL | Status: DC | PRN
  Filled 2017-08-01: qty 7

## 2017-08-01 MED ORDER — TRAZODONE HCL 50 MG PO TABS: 50.0000 mg | ORAL_TABLET | Freq: Every evening | ORAL | 0 refills | Status: DC | PRN

## 2017-08-01 MED ORDER — LEVETIRACETAM 1000 MG PO TABS: 1000.0000 mg | ORAL_TABLET | Freq: Three times a day (TID) | ORAL | 0 refills | Status: AC

## 2017-08-01 NOTE — Progress Notes (Signed)
Discharge Note Discharge instructions/medications/follow up appointments discussed with pt. Prescriptions given, and patients belongings returned to pt.   Pt verbalizes understanding. Pt given information regarding [facility] but stated she would not follow up with them at this time. Pt denies SI/HI/AVH.

## 2017-08-01 NOTE — [facility] Suicide Risk Assessment (Signed)
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 SEIZURE DISORDER [R56.9] 04/02/2006    Total Time spent with patient: 30 minutes  Musculoskeletal: Strength & Muscle Tone: within normal limits Gait & Station: normal Patient leans: N/A  Psychiatric Specialty Exam: Review of Systems  Constitutional: Negative for chills and fever.  Respiratory: Negative for cough and shortness of breath.   Cardiovascular: Negative for chest pain.  Gastrointestinal: Negative for abdominal pain, heartburn, nausea and vomiting.  Psychiatric/Behavioral: Negative for depression, hallucinations and suicidal ideas. The patient is not nervous/anxious and does not have insomnia.     Blood pressure 108/78, pulse (!) 110, temperature 97.9 F (36.6 C), resp. rate 18, height 5\' 5"  (1.651 m), weight 103 kg (227 lb), last menstrual period 06/13/2011.Body mass index is 37.77 kg/m.  General Appearance: Casual and Fairly Groomed  Patent attorney::  Good  Speech:  Clear and Coherent and Normal Rate  Volume:  Normal  Mood:  Euthymic  Affect:  Appropriate and Congruent  Thought Process:  Coherent and Goal Directed  Orientation:  Full (Time, Place, and Person)  Thought Content:  Logical  Suicidal Thoughts:  No  Homicidal Thoughts:  No  Memory:  Immediate;   Fair Recent;   Fair Remote;   Fair  Judgement:  Fair  Insight:  Fair  Psychomotor Activity:  Normal  Concentration:  Fair  Recall:  [employer] of Knowledge:Fair  Language: Fair  Akathisia:  No  Handed:    AIMS (if indicated):     Assets:  IT sales professional Social Support  Sleep:  Number of Hours: 6  Cognition: WNL  ADL's:  Intact   Mental Status Per Nursing Assessment::   On  Admission:  NA  Demographic Factors:  Low socioeconomic status, Living alone and Unemployed  Loss Factors: NA  Historical Factors: NA  Risk Reduction Factors:   Sense of responsibility to family, Positive social support, Positive therapeutic relationship and Positive coping skills or problem solving skills  Continued Clinical Symptoms:  Severe Anxiety and/or Agitation Epilepsy Unstable or Poor Therapeutic Relationship Medical Diagnoses and Treatments/Surgeries  Cognitive Features That Contribute To Risk:  None    Suicide Risk:  Minimal: No identifiable suicidal ideation.  Patients presenting with no risk factors but with morbid ruminations; may be classified as minimal risk based on the severity of the depressive symptoms  Follow-up Information    [facility] Follow up.   Specialty:  Behavioral Health Why:  Patient declined appt but agreed to accept walk in times if she changed her mind regarding follow-up. Walk in hours: Mon-Fri 8am-9am. Thank you.  Contact [address] [place] [address] [phone]         Subjective Data:  Kathleen Davis is a 52 y/o F with no previous psychiatric history who was admitted from [facility] on IVC placed in ED after she was brought in by EMS whom she called reporting concern that her neighbors were monitoring her/commenting on her seizures. She also reported history of minimal sleep for the past 9-10 days. Pt had called police/EMS at least twice before to investigate her neighbors, but each time they did not hear pt's neighbors when they arrived. Pt was medically cleared. She was started on trial of risperdal in addition to being restarted  on home medications for seizure disorder. [facility]. Her dose of risperdal was titrated up during her stay. Pt was sent out for head CT due to new onset psychosis without previous psychiatric history, and there were no acute intracranial  abnormalities found. Pt reported improvement of her presenting symptoms during her admission.  Today upon evaluation, pt shares, "I'm fine." She denies any specific concerns today. She is sleeping well. Her appetite is good. She denies other physical complaints. She denies SI/HI/AH/VH. She denies paranoia. She is tolerating her medications well, and she denies any side effects. In regards to risperdal, she comments, "It helps with my attitude." She is in agreement to continue her current treatment regimen without changes. [facility]. She was able to engage in safety planning [facility] or contact emergency services if she feels unable to maintain her own safety or the safety of others. Pt had no further questions, comments, or concerns.    Plan Of Care/Follow-up recommendations:   -Discharge to outpatient level of care  -Unspecified psychotic disorder             -Continue risperdal 2mg  po qhs             -CT head without contrast obtained 6/13 - no acute intracranial abnormalities  -Epilepsy             -Continue keppra 1000mg  po TID             -Continue lamictal 100mg  po BID  -Insomnia             -Continue trazodone 50mg  po qhs prn insomnia  -Anxiety             -Continue vistaril 25mg  po q6h prn anxiety  Activity:  as tolerated Diet:  normal Tests:  NA Other:  see above for DC plan  Micheal Likenshristopher T Dalisha Shively, MD 08/01/2017, 9:35 AM

## 2017-08-01 NOTE — Plan of Care (Signed)
Pt was able to identify coping skills for depressive symptoms at completion of recreation therapy sessions.   Caroll RancherMarjette Daisee Centner, LRT/CTRS

## 2017-08-01 NOTE — Progress Notes (Signed)
Recreation Therapy Notes  INPATIENT RECREATION TR PLAN  Patient Details Name: Kathleen Davis MRN: [id number] DOB: [birth year] Today's Date: 08/01/2017  Rec Therapy Plan Is patient appropriate for Therapeutic Recreation?: Yes Treatment times per week: about 3 days  Estimated Length of Stay: 5-7 days TR Treatment/Interventions: Group participation (Comment)  Discharge Criteria Pt will be discharged from therapy if:: Discharged Treatment plan/goals/alternatives discussed and agreed upon by:: Patient/family  Discharge Summary Short term goals set: See pt care plan Short term goals met: Complete Progress toward goals comments: Groups attended Which groups?: Self-esteem, Coping skills, Communication Reason goals not met: None Therapeutic equipment acquired: N/A Reason patient discharged from therapy: Discharge from hospital Pt/family agrees with progress & goals achieved: Yes Date patient discharged from therapy: 08/01/17     Victorino Sparrow, LRT/CTRS  Ria Comment, Jed Kutch A 08/01/2017, 12:03 PM

## 2017-08-01 NOTE — Discharge Summary (Addendum)
Physician Discharge Summary Note  Patient:  Kathleen Davis is an 52 y.o., female  MRN:  [id number]  DOB:  January 04, 1966  Patient phone:  [phone] (home)   Patient address:   [address] [place] [address],   Total Time spent with patient: Greater than 30 minutes  Date of Admission:  07/29/2017  Date of Discharge: 08-01-17  Reason for Admission: Patient's concern that her neighbors were monitoring her/commenting on her seizures. She also reported history of minimal sleep for the past 9-10 days.  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 SEIZURE DISORDER [R56.9] 04/02/2006   Past Psychiatric History: Hx. Psychosis.  Past Medical History:  Past Medical History:  Diagnosis Date  . Chronic headaches   . Depression   . Hypertension   . Seizures (HCC)    History reviewed. No pertinent surgical history.  Family History:  Family History  Problem Relation Age of Onset  . Hypertension Mother   . Diabetes Mother   . Stroke Mother   . Hypertension Father   . Cancer Father   . Seizures Father   . Hypertension Sister   . Diabetes Sister   . Hypertension Brother   . Diabetes Brother    Family Psychiatric  History: See Md's SRA  Social History:  Social History   Substance and Sexual Activity  Alcohol Use Never  . Frequency: Never     Social History   Substance and Sexual Activity  Drug Use Never    Social History   Socioeconomic History  . Marital status: Single    Spouse name: Not on file  . Number of children: Not on file  . Years of education: Not on file  . Highest education level: Not on file  Occupational History  . Not on file  Social Needs  . Financial resource strain: Not on file  . Food insecurity:    Worry: Not on file   Inability: Not on file  . Transportation needs:    Medical: Not on file    Non-medical: Not on file  Tobacco Use  . Smoking status: Former Smoker    Types: Cigarettes  . Smokeless tobacco: Never Used  . Tobacco comment: quit about 3 months ago  Substance and Sexual Activity  . Alcohol use: Never    Frequency: Never  . Drug use: Never  . Sexual activity: Yes  Lifestyle  . Physical activity:    Days per week: Not on file    Minutes per session: Not on file  . Stress: Not on file  Relationships  . Social connections:    Talks on phone: Not on file    Gets together: Not on file    Attends religious service: Not on file    Active member of club or organization: Not on file    Attends meetings of clubs or organizations: Not on file    Relationship status: Not on file  Other Topics Concern  . Not on file  Social History Narrative  . Not on file   Hospital Course: (Per Md's discharge SRA): Kathleen Davis is a 52 y/o F with no previous psychiatric history who was admitted from [facility] on IVC placed in ED after she was brought in by EMS whom she called reporting concern that her neighbors were monitoring her/commenting  on her seizures. She also reported history of minimal sleep for the past 9-10 days. Pt had called police/EMS at least twice before to investigate her neighbors, but each time they did not hear pt's neighbors when they arrived. Pt was medically cleared. She was started on trial of risperdal in addition to being restarted on home medications for seizure disorder. She was transferred to [facility] for additional treatment and stabilization. Her dose of risperdal was titrated up during her stay. Pt was sent out for head CT due to new onset psychosis without previous psychiatric history, and there were no acute intracranial abnormalities found. Pt reported improvement of her presenting symptoms during her admission.  Today upon evaluation, pt shares, "I'm fine." She denies any specific concerns  today. She is sleeping well. Her appetite is good. She denies other physical complaints. She denies SI/HI/AH/VH. She denies paranoia. She is tolerating her medications well, and she denies any side effects. In regards to risperdal, she comments, "It helps with my attitude." She is in agreement to continue her current treatment regimen without changes. She agrees to have outpatient follow up at [facility]. She was able to engage in safety planning including plan to return to [facility] or contact emergency services if she feels unable to maintain her own safety or the safety of others. Pt had no further questions, comments, or concerns.She left [facility] with all personal belongings in no apparent distress. Transportation per the city bus. [facility] assisted with bus pass.  Plan Of Care/Follow-up recommendations:   -Discharge to outpatient level of care  -Unspecified psychotic disorder -Continue risperdal 2mg  po qhs -CT head without contrast obtained 6/13 - no acute intracranial abnormalities  -Epilepsy -Continue keppra 1000mg  po TID -Continue lamictal 100mg  po BID  -Insomnia -Continue trazodone 50mg  po qhs prn insomnia  -Anxiety -Continue vistaril 25mg  po q6h prn anxiety  Physical Findings: AIMS: Facial and Oral Movements Muscles of Facial Expression: None, normal Lips and Perioral Area: None, normal Jaw: None, normal Tongue: None, normal,Extremity Movements Upper (arms, wrists, hands, fingers): None, normal Lower (legs, knees, ankles, toes): None, normal, Trunk Movements Neck, shoulders, hips: None, normal, Overall Severity Severity of abnormal movements (highest score from questions above): None, normal Incapacitation due to abnormal movements: None, normal Patient's awareness of abnormal movements (rate only patient's report): No Awareness, Dental Status Current problems with teeth and/or dentures?: No Does patient usually  wear dentures?: No  CIWA:    COWS:     Musculoskeletal: Strength & Muscle Tone: within normal limits Gait & Station: normal Patient leans: N/A  Psychiatric Specialty Exam: Physical Exam  Constitutional: She appears well-developed.  HENT:  Head: Normocephalic.  Eyes: Pupils are equal, round, and reactive to light.  Neck: Normal range of motion.  Cardiovascular: Normal rate.  Respiratory: Effort normal.  GI: Soft.  Genitourinary:  Genitourinary Comments: Deferred  Musculoskeletal: Normal range of motion.  Neurological: She is alert.  Skin: Skin is warm.    Review of Systems  Constitutional: Negative.   HENT: Negative.   Eyes: Negative.   Respiratory: Negative.   Cardiovascular: Negative.   Gastrointestinal: Negative.   Genitourinary: Negative.   Musculoskeletal: Negative.   Skin: Negative.   Neurological: Negative.   Endo/Heme/Allergies: Negative.   Psychiatric/Behavioral: Positive for depression (Stabilized with medication prior to discharge), hallucinations (Hx. psychosis (stable)) and substance abuse (Hx. THC use). Negative for memory loss and suicidal ideas. The patient has insomnia (Stabilized with medication prior to discharge). The patient is not nervous/anxious.     Blood pressure  108/78, pulse (!) 110, temperature 97.9 F (36.6 C), resp. rate 18, height 5\' 5"  (1.651 m), weight 103 kg (227 lb), last menstrual period 06/13/2011.Body mass index is 37.77 kg/m.  See Md's SRA   Have you used any form of tobacco in the last 30 days? (Cigarettes, Smokeless Tobacco, Cigars, and/or Pipes): No  Has this patient used any form of tobacco in the last 30 days? (Cigarettes, Smokeless Tobacco, Cigars, and/or Pipes): N/A  Blood Alcohol level:  Lab Results  Component Value Date   ETH <10 07/28/2017   Metabolic Disorder Labs:  No results found for: HGBA1C, MPG No results found for: PROLACTIN Lab Results  Component Value Date   CHOL 200 02/24/2007   TRIG 121 02/24/2007    HDL 53 02/24/2007   CHOLHDL 3.8 Ratio 02/24/2007   VLDL 24 02/24/2007   LDLCALC 123 (H) 02/24/2007   See Psychiatric Specialty Exam and Suicide Risk Assessment completed by Attending Physician prior to discharge.  Discharge destination:  Home  Is patient on multiple antipsychotic therapies at discharge:  No   Has Patient had three or more failed trials of antipsychotic monotherapy by history:  No  Recommended Plan for Multiple Antipsychotic Therapies: NA  Allergies as of 08/01/2017   No Known Allergies     Medication List    STOP taking these medications   ondansetron 4 MG tablet Commonly known as:  ZOFRAN     TAKE these medications     Indication  hydrOXYzine 25 MG tablet Commonly known as:  ATARAX/VISTARIL Take 1 tablet (25 mg total) by mouth every 6 (six) hours as needed for itching or anxiety.  Indication:  Feeling Anxious   lamoTRIgine 100 MG tablet Commonly known as:  LAMICTAL Take 1 tablet (100 mg total) by mouth 2 (two) times daily. For mood stabilization What changed:  additional instructions  Indication:  Mood stabilization   levETIRAcetam 1000 MG tablet Commonly known as:  KEPPRA Take 1 tablet (1,000 mg total) by mouth 3 (three) times daily. For seizures What changed:  additional instructions  Indication:  Seizure   risperiDONE 2 MG tablet Commonly known as:  RISPERDAL Take 1 tablet (2 mg total) by mouth at bedtime. For mood control  Indication:  Mood control   traZODone 50 MG tablet Commonly known as:  DESYREL Take 1 tablet (50 mg total) by mouth at bedtime as needed for sleep.  Indication:  Trouble Sleeping      Follow-up Information    [facility] Follow up.   Specialty:  Behavioral Health Why:  Patient declined appt but agreed to accept walk in times if she changed her mind regarding follow-up. Walk in hours: Mon-Fri 8am-9am. Thank you.  Contact information[address] [place] [address] [phone]          Follow-up  recommendations: Activity:  As tolerated Diet: As recommended by your primary care doctor. Keep all scheduled follow-up appointments as recommended.    Comments:  Patient is instructed prior to discharge to: Take all medications as prescribed by his/her mental healthcare provider. Report any adverse effects and or reactions from the medicines to his/her outpatient provider promptly. Patient has been instructed & cautioned: To not engage in alcohol and or illegal drug use while on prescription medicines. In the event of worsening symptoms, patient is instructed to call the crisis hotline, 911 and or go to the nearest ED for appropriate evaluation and treatment of symptoms. To follow-up with his/her primary care provider for your other medical issues,  concerns and or health care needs.   Signed: Armandina Stammer, NP, PMHNP, FNP-BC 08/01/2017, 10:12 AM   Patient seen, Suicide Assessment Completed.  Disposition Plan Reviewed

## 2017-08-01 NOTE — Progress Notes (Signed)
  [facility] Discharge [facility] :  Will you be returning to the same living situation after discharge:  Yes,  home At discharge, do you have transportation home?: Yes,  bus pass Do you have the ability to pay for your medications: Yes,  mental health  Release of information consent forms completed and in the chart;  Patient's signature needed at discharge.  Patient to Follow up at: Follow-up Information    [facility] Follow up.   Specialty:  Behavioral Health Why:  Patient declined appt but agreed to accept walk in times if she changed her mind regarding follow-up. Walk in hours: Mon-Fri 8am-9am. Thank you.  Contact information: [address] [place] [address] [phone]           Next level of care provider has access to [facility] Link:no  Safety Planning and Suicide Prevention discussed: Yes,  yes  Have you used any form of tobacco in the last 30 days? (Cigarettes, Smokeless Tobacco, Cigars, and/or Pipes): No  Has patient been referred to the Quitline?: N/A patient is not a smoker  Patient has been referred for addiction treatment: N/A  Ida RogueRodney B Lettie Czarnecki, LCSW 08/01/2017, 9:30 AM

## 2017-08-01 NOTE — Progress Notes (Signed)
Recreation Therapy Notes  Date: 6.14.19 Time: 1000 Location: [address] Dayroom  Group Topic: Self-Esteem  Goal Area(s) Addresses:  Patient will successfully identify positive attributes about themselves.  Patient will successfully identify benefit of improved self-esteem.   Behavioral Response: Engaged  Intervention: Magazines, scissors, glue sticks, Architect paper  Activity: Collage About Me.  Patients were instructed to use the supplies provided to create a collage of things that describe them.  Education:  Self-Esteem, Dentist.   Education Outcome: Acknowledges education/In group clarification offered/Needs additional education  Clinical Observations/Feedback: Pt stated she went to high school and college in [place] and met her husband in college.  Pt stated she likes to go out to dinner every weekend with her husband and get all dressed up.    Victorino Sparrow, LRT/CTRS         Ria Comment, Dupree Givler A 08/01/2017 11:45 AM

## 2017-08-01 NOTE — Plan of Care (Signed)
D: Pt denies SI/HI/AVH. Pt is pleasant and cooperative. Pt was paranoid on unit this evening.   A: Pt was offered support and encouragement. Pt was given scheduled medications. Pt was encourage to attend groups. Q 15 minute checks were done for safety.   R: safety maintained on unit.   Problem: Education: Goal: Emotional status will improve Outcome: Progressing   Problem: Education: Goal: Mental status will improve Outcome: Progressing   Problem: Activity: Goal: Sleeping patterns will improve Outcome: Progressing

## 2017-08-26 DIAGNOSIS — F4310 Post-traumatic stress disorder, unspecified: Secondary | ICD-10-CM | POA: Diagnosis not present

## 2017-08-26 DIAGNOSIS — F4321 Adjustment disorder with depressed mood: Secondary | ICD-10-CM | POA: Diagnosis not present

## 2017-10-10 ENCOUNTER — Other Ambulatory Visit: Payer: Self-pay

## 2017-10-10 ENCOUNTER — Encounter (HOSPITAL_COMMUNITY): Payer: Self-pay

## 2017-10-10 ENCOUNTER — Emergency Department (HOSPITAL_COMMUNITY)
Admission: EM | Admit: 2017-10-10 | Discharge: 2017-10-10 | Disposition: A | Discharge status: Home / Self Care | Payer: Self-pay | Attending: Emergency Medicine | Admitting: Emergency Medicine

## 2017-10-10 DIAGNOSIS — Z760 Encounter for issue of repeat prescription: Secondary | ICD-10-CM | POA: Insufficient documentation

## 2017-10-10 DIAGNOSIS — Z79899 Other long term (current) drug therapy: Secondary | ICD-10-CM | POA: Insufficient documentation

## 2017-10-10 DIAGNOSIS — I10 Essential (primary) hypertension: Secondary | ICD-10-CM | POA: Insufficient documentation

## 2017-10-10 DIAGNOSIS — Z87891 Personal history of nicotine dependence: Secondary | ICD-10-CM | POA: Insufficient documentation

## 2017-10-10 MED ORDER — LEVETIRACETAM 500 MG PO TABS
1000.0000 mg | ORAL_TABLET | Freq: Once | ORAL | Status: AC
  Administered 2017-10-10: 1000 mg via ORAL
  Filled 2017-10-10: qty 2

## 2017-10-10 NOTE — ED Provider Notes (Signed)
[facility]-EMERGENCY DEPT Provider Note   CSN: [id number] Arrival date & time: 10/10/17  [id number]     History   Chief Complaint Chief Complaint  Patient presents with  . Medication Refill    HPI Kathleen Davis is a 52 y.o. female presenting for overdose.  Patient states she ran out of her Keppra last week.  She is here to get a dose of her medication.  She reports that she has refills at the pharmacy, she just does not have the money to get them filled right now.  Patient states that if we give her a dose in the ER, she will call her uncle tomorrow to get a wire transferred to pick up the medication.  Patient states she thinks she might be having seizures because it feels like her neighbors are poking her with cattle products.  Denies syncopal event.  Patient states her next follow-up with her neurologist is in November, since she missed her most recent appointment.  She denies fall, trauma, or injury.  She has no complaints and no distress at this time.  She denies a history of high blood pressure, states it has been high in the past but they have always retracted and it has been normal. Patient reports a headache currently, denies vision changes, slurred speech, numbness, or tingling.  She denies chest pain, shortness of breath, or difficulty urinating.  Additional history obtained from chart review.  EMS reports patient told him her son was stealing her seizure medication, although her son is deceased.  Recent visits/medical care has been complicated by patient's psychiatric history which includes paranoia and psychosis.  Per chart review, neurology has had a difficult time getting touch with the patient to set up and appointment, but would like to see her.   HPI  Past Medical History:  Diagnosis Date  . Chronic headaches   . Depression   . Hypertension   . Seizures [facility](HCC)     Patient Active Problem List   Diagnosis Date Noted  . 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 SEIZURE DISORDER 04/02/2006    History reviewed. No pertinent surgical history.   OB History   None      Home Medications    Prior to Admission medications   Medication Sig Start Date End Date Taking? Authorizing Provider  hydrOXYzine (ATARAX/VISTARIL) 25 MG tablet Take 1 tablet (25 mg total) by mouth every 6 (six) hours as needed for itching or anxiety. 08/01/17  Yes Armandina StammerNwoko, Agnes I, NP  lamoTRIgine (LAMICTAL) 100 MG tablet Take 1 tablet (100 mg total) by mouth 2 (two) times daily. For mood stabilization 08/01/17  Yes Nwoko, Nicole KindredAgnes I, NP  levETIRAcetam (KEPPRA) 1000 MG tablet Take 1 tablet (1,000 mg total) by mouth 3 (three) times daily. For seizures 08/01/17  Yes Armandina StammerNwoko, Agnes I, NP  risperiDONE (RISPERDAL) 2 MG tablet Take 1 tablet (2 mg total) by mouth at bedtime. For mood control 08/01/17  Yes Armandina StammerNwoko, Agnes I, NP  traZODone (DESYREL) 50 MG tablet Take 1 tablet (50 mg total) by mouth at bedtime as needed for sleep. 08/01/17  Yes Sanjuana KavaNwoko, Agnes I, NP    Family History Family History  Problem Relation Age of Onset  . Hypertension Mother   . Diabetes Mother   . Stroke Mother   . Hypertension Father   . Cancer Father   . Seizures Father   . Hypertension Sister   . Diabetes Sister   .  Hypertension Brother   . Diabetes Brother     Social History Social History   Tobacco Use  . Smoking status: Former Smoker    Types: Cigarettes  . Smokeless tobacco: Never Used  . Tobacco comment: quit about 3 months ago  Substance Use Topics  . Alcohol use: Never    Frequency: Never  . Drug use: Never     Allergies   Patient has no known allergies.   Review of Systems Review of Systems  Neurological: Positive for seizures (possible) and headaches.  All other systems reviewed and are negative.    Physical Exam Updated Vital Signs BP 119/85 (BP Location: Right Arm)   Pulse 72   Temp 98.2 F (36.8 C) (Oral)   Resp 18   Ht 5\' 4"   (1.626 m)   Wt 98.9 kg   LMP 06/13/2011   SpO2 100%   BMI 37.42 kg/m   Physical Exam  Constitutional: She is oriented to person, place, and time. She appears well-developed and well-nourished. No distress.  Sitting in the chair comfortably in no apparent distress  HENT:  Head: Normocephalic and atraumatic.  No obvious head injury  Eyes: Pupils are equal, round, and reactive to light. Conjunctivae and EOM are normal.  EOMI and PERRLA.  No nystagmus.  Neck: Normal range of motion. Neck supple.  Cardiovascular: Normal rate, regular rhythm and intact distal pulses.  Pulmonary/Chest: Effort normal and breath sounds normal. No respiratory distress. She has no wheezes.  Abdominal: Soft. She exhibits no distension. There is no tenderness.  Genitourinary:  Genitourinary Comments: No obvious urinary incontinence  Musculoskeletal: Normal range of motion. She exhibits no tenderness.  Strength intact x4.  Sensation intact x4.  Radial pedal pulses intact bilaterally.  No obvious injury or deformity.  Neurological: She is alert and oriented to person, place, and time. She displays normal reflexes. No cranial nerve deficit or sensory deficit. Coordination normal.  No obvious neurologic deficits.  CN intact.  Nose to finger intact.  Grip strength intact.  Fine movement and coordination intact.  Patient is ambulatory without difficulty or signs of ataxia.  Negative pronator drift.  Negative Romberg  Skin: Skin is warm and dry. Capillary refill takes less than 2 seconds.  Psychiatric: She has a normal mood and affect.  Calm and cooperative.  Nursing note and vitals reviewed.    ED Treatments / Results  Labs (all labs ordered are listed, but only abnormal results are displayed) Labs Reviewed - No data to display  EKG None  Radiology No results found.  Procedures Procedures (including critical care time)  Medications Ordered in ED Medications  levETIRAcetam (KEPPRA) tablet 1,000 mg (1,000  mg Oral Given 10/10/17 2030)     Initial Impression / Assessment and Plan / ED Course  I have reviewed the triage vital signs and the nursing notes.  Pertinent labs & imaging results that were available during my care of the patient were reviewed by me and considered in my medical decision making (see chart for details).     Patient presenting for Keppra medication.  States she will be able to pick up her medication tomorrow at the pharmacy.  No current seizure-like activity noted.  Reassuring neurologic exam.  Patient's blood pressure concerningly high, with associated headache.  Per chart review, patient had a normal CT scan 2 months ago.  Discussed with attending, Dr. Rodena Medin agrees to plan.  Will give dose of Keppra and reassess blood pressure.  On reassessment, blood pressure has resolved.  Patient remains seizure-free.  Doubt CVA, TIA, current seizures, hypertensive emergency/urgency, or acute psychiatric psychosis/illness.  Chart review, patient appears at her baseline. Patient encouraged to follow-up with her neurologist sooner than November.  Patient to return as needed.  At this time, patient appears safe for discharge.  Return precautions given.  Patient states she understands and agrees to plan.  Final Clinical Impressions(s) / ED Diagnoses   Final diagnoses:  Medication refill    ED Discharge Orders    None       Alveria Apley, PA-C 10/10/17 2131    Wynetta Fines, MD 10/13/17 [phone]

## 2017-10-10 NOTE — Discharge Instructions (Addendum)
Continue taking your home medications daily.  It is important that you pick up your Keppra prescription from the pharmacy tomorrow. [facility] sooner than November.  Ideally, you should be seen within the next week. Return to the emergency room if you develop any new, worsening, or concerning symptoms.

## 2017-10-10 NOTE — ED Triage Notes (Signed)
Per ems: pt coming from home c/o "her son is stealing her seizure medication and she needs a refill." According to gpd, son is deceased and they frequently run this call for paranoid delusional behavior. Pt states "someone is transmitting seizures into her head from the roof of her apartment"   Pt on Keppra and Zofran

## 2017-10-28 ENCOUNTER — Emergency Department (HOSPITAL_COMMUNITY): Payer: Medicare Other

## 2017-10-28 ENCOUNTER — Encounter (HOSPITAL_COMMUNITY): Payer: Self-pay | Admitting: *Deleted

## 2017-10-28 ENCOUNTER — Emergency Department (HOSPITAL_COMMUNITY)
Admission: EM | Admit: 2017-10-28 | Discharge: 2017-10-29 | Disposition: A | Discharge status: Home / Self Care | Payer: Medicare Other | Attending: Emergency Medicine | Admitting: Emergency Medicine

## 2017-10-28 ENCOUNTER — Other Ambulatory Visit: Payer: Self-pay

## 2017-10-28 DIAGNOSIS — R600 Localized edema: Secondary | ICD-10-CM | POA: Insufficient documentation

## 2017-10-28 DIAGNOSIS — R0602 Shortness of breath: Secondary | ICD-10-CM | POA: Diagnosis not present

## 2017-10-28 DIAGNOSIS — Z87891 Personal history of nicotine dependence: Secondary | ICD-10-CM | POA: Insufficient documentation

## 2017-10-28 DIAGNOSIS — R2242 Localized swelling, mass and lump, left lower limb: Secondary | ICD-10-CM | POA: Diagnosis present

## 2017-10-28 DIAGNOSIS — Z79899 Other long term (current) drug therapy: Secondary | ICD-10-CM | POA: Insufficient documentation

## 2017-10-28 DIAGNOSIS — R609 Edema, unspecified: Secondary | ICD-10-CM

## 2017-10-28 LAB — CBC WITH DIFFERENTIAL/PLATELET
Abs Immature Granulocytes: 0 10*3/uL (ref 0.0–0.1)
Basophils Absolute: 0 10*3/uL (ref 0.0–0.1)
Basophils Relative: 0 %
Eosinophils Absolute: 0 10*3/uL (ref 0.0–0.7)
Eosinophils Relative: 0 %
HCT: 40.3 % (ref 36.0–46.0)
Hemoglobin: 12.5 g/dL (ref 12.0–15.0)
Immature Granulocytes: 0 %
Lymphocytes Relative: 28 %
Lymphs Abs: 2.6 10*3/uL (ref 0.7–4.0)
MCH: 29.6 pg (ref 26.0–34.0)
MCHC: 31 g/dL (ref 30.0–36.0)
MCV: 95.5 fL (ref 78.0–100.0)
Monocytes Absolute: 0.6 10*3/uL (ref 0.1–1.0)
Monocytes Relative: 7 %
Neutro Abs: 6 10*3/uL (ref 1.7–7.7)
Neutrophils Relative %: 65 %
Platelets: 374 10*3/uL (ref 150–400)
RBC: 4.22 MIL/uL (ref 3.87–5.11)
RDW: 13.6 % (ref 11.5–15.5)
WBC: 9.3 10*3/uL (ref 4.0–10.5)

## 2017-10-28 LAB — I-STAT TROPONIN, ED: Troponin i, poc: 0.04 ng/mL (ref 0.00–0.08)

## 2017-10-28 LAB — BRAIN NATRIURETIC PEPTIDE: B Natriuretic Peptide: 12.2 pg/mL (ref 0.0–100.0)

## 2017-10-28 LAB — BASIC METABOLIC PANEL
Anion gap: 14 (ref 5–15)
BUN: 10 mg/dL (ref 6–20)
CO2: 22 mmol/L (ref 22–32)
Calcium: 10 mg/dL (ref 8.9–10.3)
Chloride: 107 mmol/L (ref 98–111)
Creatinine, Ser: 1.07 mg/dL — ABNORMAL HIGH (ref 0.44–1.00)
GFR calc Af Amer: >60 mL/min (ref >60)
GFR calc non Af Amer: 59 mL/min — ABNORMAL LOW (ref >60)
Glucose, Bld: 110 mg/dL — ABNORMAL HIGH (ref 70–99)
Potassium: 3.6 mmol/L (ref 3.5–5.1)
Sodium: 143 mmol/L (ref 135–145)

## 2017-10-28 LAB — D-DIMER, QUANTITATIVE (NOT AT ARMC): D-Dimer, Quant: 0.88 ug/mL-FEU — ABNORMAL HIGH (ref 0.00–0.50)

## 2017-10-28 MED ORDER — IOPAMIDOL (ISOVUE-370) INJECTION 76%
INTRAVENOUS | Status: AC
  Filled 2017-10-28: qty 100

## 2017-10-28 MED ORDER — IOPAMIDOL (ISOVUE-370) INJECTION 76%
100.0000 mL | Freq: Once | INTRAVENOUS | Status: AC | PRN
  Administered 2017-10-28: 100 mL via INTRAVENOUS

## 2017-10-28 NOTE — ED Triage Notes (Signed)
Pt reports several days of swelling to bilateral knees and ankles. No sob. No acute distress is noted at triage.

## 2017-10-28 NOTE — ED Provider Notes (Signed)
Patient placed in Quick Look pathway, seen and evaluated   Chief Complaint: Lower extremity swelling, shortness of breath.  HPI:   Patient notes progressively worsening bilateral lower extremity edema, right worse than left for the past week.  She notes for the past few days she has felt intermittently short of breath but cannot tell me if this is exertional or pleuritic in nature.  No aggravating or alleviating factors noted.  Denies fevers, numbness, weakness.  No recent injury.  She has tried elevating the extremities without significant relief.  No recent travel or surgeries, no hemoptysis, no prior history of DVT or PE.  Not on OCPs or estrogen hormone replacement therapy.  ROS: Positive for leg swelling, shortness of breath Negative for fever, chest pain, numbness, weakness  Physical Exam:   Gen: No distress  Neuro: Awake and Alert  Skin: Warm    Focused Exam: Tachycardic, no murmurs rubs or gallops noted.  Mild bibasilar crackles noted on auscultation of the lungs.  Trace pitting edema of the bilateral lower extremities near the ankles.  Right calf tender to palpation without any significant swelling as compared to the left.  Normal range of motion of the extremities.  Ambulatory without difficulty.   Initiation of care has begun. The patient has been counseled on the process, plan, and necessity for staying for the completion/evaluation, and the remainder of the medical screening examination    Bennye Alm 10/28/17 1911    Lorre Nick, MD 10/29/17 [phone]

## 2017-10-28 NOTE — ED Provider Notes (Signed)
[facility] EMERGENCY DEPARTMENT Provider Note   CSN: [id number] Arrival date & time: 10/28/17  1751     History   Chief Complaint Chief Complaint  Patient presents with  . Leg Swelling    HPI Kathleen Davis is a 52 y.o. female.  HPI  This is a 52 year old female with a history of seizures and hypertension who presents with lower extremity swelling.  Patient reports waxing and waning worsening lower extremity swelling right greater than left.  She states that it gets somewhat improved with elevation but at the end of the day is worse.  She has noted increased pain specifically in the right leg.  Denies any history of blood clots, recent travel, recent hospitalization.  She does report some shortness of breath and chest tightness mostly with laying flat.  No exertional shortness of breath or chest pain.  Denies any worsening pain with breathing.  She is not on any diuretic medications.  Past Medical History:  Diagnosis Date  . Chronic headaches   . Depression   . Hypertension   . Seizures [facility])     Patient Active Problem List   Diagnosis Date Noted  . 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 SEIZURE DISORDER 04/02/2006    History reviewed. No pertinent surgical history.   OB History   None      Home Medications    Prior to Admission medications   Medication Sig Start Date End Date Taking? Authorizing Provider  furosemide (LASIX) 20 MG tablet Take 1 tablet (20 mg total) by mouth daily. 10/29/17   Keirstin Musil, Mayer Masker, MD  hydrOXYzine (ATARAX/VISTARIL) 25 MG tablet Take 1 tablet (25 mg total) by mouth every 6 (six) hours as needed for itching or anxiety. 08/01/17   Armandina Stammer I, NP  lamoTRIgine (LAMICTAL) 100 MG tablet Take 1 tablet (100 mg total) by mouth 2 (two) times daily. For mood stabilization 08/01/17   Armandina Stammer I, NP  levETIRAcetam (KEPPRA) 1000 MG tablet Take 1 tablet (1,000 mg total) by mouth 3  (three) times daily. For seizures 08/01/17   Armandina Stammer I, NP  risperiDONE (RISPERDAL) 2 MG tablet Take 1 tablet (2 mg total) by mouth at bedtime. For mood control 08/01/17   Armandina Stammer I, NP  traZODone (DESYREL) 50 MG tablet Take 1 tablet (50 mg total) by mouth at bedtime as needed for sleep. 08/01/17   Sanjuana Kava, NP    Family History Family History  Problem Relation Age of Onset  . Hypertension Mother   . Diabetes Mother   . Stroke Mother   . Hypertension Father   . Cancer Father   . Seizures Father   . Hypertension Sister   . Diabetes Sister   . Hypertension Brother   . Diabetes Brother     Social History Social History   Tobacco Use  . Smoking status: Former Smoker    Types: Cigarettes  . Smokeless tobacco: Never Used  . Tobacco comment: quit about 3 months ago  Substance Use Topics  . Alcohol use: Never    Frequency: Never  . Drug use: Never     Allergies   Patient has no known allergies.   Review of Systems Review of Systems  Constitutional: Negative for fever.  Respiratory: Positive for chest tightness and shortness of breath.   Cardiovascular: Positive for leg swelling. Negative for chest pain.  Gastrointestinal: Negative for abdominal pain, nausea and vomiting.  Genitourinary: Positive for  dysuria.  Skin: Negative for color change.  All other systems reviewed and are negative.    Physical Exam Updated Vital Signs BP (!) 143/83   Pulse 72   Temp 98.7 F (37.1 C) (Oral)   Resp 17   LMP 06/13/2011   SpO2 98%   Physical Exam  Constitutional: She is oriented to person, place, and time. She appears well-developed and well-nourished.  Overweight, nontoxic-appearing  HENT:  Head: Normocephalic and atraumatic.  Eyes: Pupils are equal, round, and reactive to light.  Cardiovascular: Normal rate, regular rhythm and normal heart sounds.  Pulmonary/Chest: Effort normal and breath sounds normal. No respiratory distress. She has no wheezes.    Abdominal: Soft. Bowel sounds are normal. There is no tenderness.  Musculoskeletal:  1+ bilateral lower extremity edema, tenderness to palpation of the bilateral calves, no overlying skin changes  Neurological: She is alert and oriented to person, place, and time.  Skin: Skin is warm and dry.  Psychiatric: She has a normal mood and affect.  Nursing note and vitals reviewed.    ED Treatments / Results  Labs (all labs ordered are listed, but only abnormal results are displayed) Labs Reviewed  BASIC METABOLIC PANEL - Abnormal; Notable for the following components:      Result Value   Glucose, Bld 110 (*)    Creatinine, Ser 1.07 (*)    GFR calc non Af Amer 59 (*)    All other components within normal limits  D-DIMER, QUANTITATIVE (NOT AT [facility]) - Abnormal; Notable for the following components:   D-Dimer, Quant 0.88 (*)    All other components within normal limits  CBC WITH DIFFERENTIAL/PLATELET  BRAIN NATRIURETIC PEPTIDE  I-STAT TROPONIN, ED    EKG EKG Interpretation  Date/Time:  Tuesday October 28 2017 19:23:23 EDT Ventricular Rate:  94 PR Interval:  162 QRS Duration: 86 QT Interval:  356 QTC Calculation: 445 R Axis:   64 Text Interpretation:  Normal sinus rhythm Normal ECG Confirmed by Ross Marcus ([id number]) on 10/28/2017 11:05:31 PM   Radiology Dg Chest 2 View  Result Date: 10/28/2017 CLINICAL DATA:  Shortness of Breath EXAM: CHEST - 2 VIEW COMPARISON:  September 11, 2007 FINDINGS: There is no edema or consolidation. Heart size and pulmonary vascularity are normal. No adenopathy. No evident bone lesions. IMPRESSION: No edema or consolidation. Electronically Signed   By: Bretta Bang III M.D.   On: 10/28/2017 19:53   Ct Angio Chest Pe W And/or Wo Contrast  Result Date: 10/29/2017 CLINICAL DATA:  BILATERAL lower extremity swelling, intermittent shortness of breath, hypertension, elevated D-dimer, question pulmonary embolism, history hypertension, former smoker EXAM:  CT ANGIOGRAPHY CHEST WITH CONTRAST TECHNIQUE: Multidetector CT imaging of the chest was performed using the standard protocol during bolus administration of intravenous contrast. Multiplanar CT image reconstructions and MIPs were obtained to evaluate the vascular anatomy. CONTRAST:  ISOVUE-370 IOPAMIDOL (ISOVUE-370) INJECTION 76% IV COMPARISON:  None FINDINGS: Cardiovascular: Aorta normal caliber without aneurysm or dissection. Heart unremarkable. Minimal pericardial fluid. Pulmonary arteries well opacified and patent. No evidence of pulmonary embolism. Mediastinum/Nodes: Esophagus unremarkable. Base of cervical region normal appearance. No thoracic adenopathy. Lungs/Pleura: Minimal dependent atelectasis. Atelectasis versus scarring at base of RIGHT middle lobe. Lungs otherwise clear. No pulmonary infiltrate, pleural effusion, or pneumothorax. Upper Abdomen: Cholelithiasis. Remaining visualized upper abdomen unremarkable. Musculoskeletal: No acute osseous findings. Review of the MIP images confirms the above findings. IMPRESSION: No evidence of pulmonary embolism. Scarring versus atelectasis at base of RIGHT middle lobe with minimal dependent  atelectasis in the posterior lungs. Cholelithiasis. Electronically Signed   By: Ulyses Southward M.D.   On: 10/29/2017 00:04    Procedures Procedures (including critical care time)  Medications Ordered in ED Medications  iopamidol (ISOVUE-370) 76 % injection (has no administration in time range)  enoxaparin (LOVENOX) injection 100 mg (has no administration in time range)  iopamidol (ISOVUE-370) 76 % injection 100 mL (100 mLs Intravenous Contrast Given 10/28/17 2344)     Initial Impression / Assessment and Plan / ED Course  I have reviewed the triage vital signs and the nursing notes.  Pertinent labs & imaging results that were available during my care of the patient were reviewed by me and considered in my medical decision making (see chart for details).      Patient presents with lower extremity swelling and pain.  She is overall nontoxic-appearing vital signs are reassuring.  She has swelling on exam with some tenderness.  Swelling appears more symmetric.  Would suspect volume overload or dietary indiscretion.  No history of heart disease.  Blood clots are also a consideration.  Lab work reviewed from triage.  She did have a d-dimer that was positive.  She does report some shortness of breath but is satting 100% on room air.  CT angios obtained to rule out PE.  This is reassuring.  Patient was given 1 dose of Lovenox.  She will return later today for lower extremity Dopplers.  She will also be given a short course of Lasix.  Encouraged to keep her legs elevated and follow-up closely with her primary physician for recheck after trial of Lasix.  After history, exam, and medical workup I feel the patient has been appropriately medically screened and is safe for discharge home. Pertinent diagnoses were discussed with the patient. Patient was given return precautions.   Final Clinical Impressions(s) / ED Diagnoses   Final diagnoses:  Peripheral edema    ED Discharge Orders         Ordered    LE VENOUS     10/29/17 0124    furosemide (LASIX) 20 MG tablet  Daily     10/29/17 0124           Shon Baton, MD 10/29/17 0127

## 2017-10-29 DIAGNOSIS — R600 Localized edema: Secondary | ICD-10-CM | POA: Diagnosis not present

## 2017-10-29 MED ORDER — FUROSEMIDE 20 MG PO TABS: 20.0000 mg | ORAL_TABLET | Freq: Every day | ORAL | 0 refills | Status: DC

## 2017-10-29 MED ORDER — ENOXAPARIN SODIUM 100 MG/ML ~~LOC~~ SOLN
1.0000 mg/kg | Freq: Once | SUBCUTANEOUS | Status: AC
Start: 2017-10-29 — End: 2017-10-29
  Administered 2017-10-29: 100 mg via SUBCUTANEOUS
  Filled 2017-10-29: qty 1

## 2017-10-29 NOTE — Discharge Instructions (Addendum)
Were seen today for lower extremity swelling.  Your work-up so far has been reassuring.  Return later today for an ultrasound to rule out blood clot.  You will be given a short course of diuretic.  Keep legs elevated.  You may also try compression stockings.  If you see improvement with the medication, follow-up with your primary physician for recheck and possible initiation of long-term diuretic.

## 2017-10-29 NOTE — ED Notes (Signed)
Pt. Ambulated to bathroom with steady gait.  

## 2017-10-30 ENCOUNTER — Ambulatory Visit (HOSPITAL_COMMUNITY): Admission: RE | Admit: 2017-10-30 | Payer: Self-pay | Source: Ambulatory Visit

## 2017-12-16 ENCOUNTER — Ambulatory Visit: Payer: Self-pay | Attending: Nurse Practitioner | Admitting: Nurse Practitioner

## 2017-12-16 ENCOUNTER — Encounter: Payer: Self-pay | Admitting: Nurse Practitioner

## 2017-12-16 VITALS — BP 139/81 | HR 68 | Temp 98.3°F | Ht 64.0 in | Wt 230.0 lb

## 2017-12-16 DIAGNOSIS — I10 Essential (primary) hypertension: Secondary | ICD-10-CM | POA: Insufficient documentation

## 2017-12-16 DIAGNOSIS — Z0000 Encounter for general adult medical examination without abnormal findings: Secondary | ICD-10-CM

## 2017-12-16 DIAGNOSIS — Z8249 Family history of ischemic heart disease and other diseases of the circulatory system: Secondary | ICD-10-CM | POA: Insufficient documentation

## 2017-12-16 DIAGNOSIS — Z79899 Other long term (current) drug therapy: Secondary | ICD-10-CM | POA: Insufficient documentation

## 2017-12-16 DIAGNOSIS — Z87891 Personal history of nicotine dependence: Secondary | ICD-10-CM | POA: Insufficient documentation

## 2017-12-16 DIAGNOSIS — Z01419 Encounter for gynecological examination (general) (routine) without abnormal findings: Secondary | ICD-10-CM | POA: Insufficient documentation

## 2017-12-16 NOTE — Progress Notes (Signed)
Assessment & Plan:  Kathleen Davis was seen today for annual exam.  Diagnoses and all orders for this visit:  Well woman exam without gynecological exam Schedule for pap smear and ABI with nubea on same day.   Patient has been counseled on age-appropriate routine health concerns for screening and prevention. These are reviewed and up-to-date. Referrals have been placed accordingly. Immunizations are up-to-date or declined.    Subjective:   Chief Complaint  Patient presents with  . Annual Exam    Pt. is here for a physical.    HPI Kathleen Davis 52 y.o. female presents to office today for annual physical. She endorses intermittent pain in her BLE. Pain is described as sharp, stabbing and only lasting for a few minutes. The pain goes away on its own. She does have a longstanding previous history of tobacco dependence but no longer smokes. She denies any discoloration of her lower extremities or pain aggravated by exercise or activity.    Review of Systems  Constitutional: Negative.  Negative for chills, fever, malaise/fatigue and weight loss.  HENT: Negative.  Negative for congestion, hearing loss, sinus pain and sore throat.   Eyes: Negative.  Negative for blurred vision, double vision, photophobia and pain.  Respiratory: Negative.  Negative for cough, sputum production, shortness of breath and wheezing.   Cardiovascular: Negative.  Negative for chest pain and leg swelling.  Gastrointestinal: Negative.  Negative for abdominal pain, constipation, diarrhea, heartburn, nausea and vomiting.  Genitourinary: Negative.   Musculoskeletal: Negative.  Negative for joint pain and myalgias.  Skin: Negative.  Negative for rash.  Neurological: Positive for tingling, sensory change, seizures (history of; controlled/stable) and headaches (chronic). Negative for dizziness, tremors, speech change and focal weakness.  Endo/Heme/Allergies: Negative.  Negative for environmental allergies.  Psychiatric/Behavioral:  Positive for depression. Negative for suicidal ideas. The patient is not nervous/anxious and does not have insomnia.     Past Medical History:  Diagnosis Date  . Chronic headaches   . Depression   . Hypertension   . Seizures (HCC)     Past Surgical History:  Procedure Laterality Date  . TUBAL LIGATION      Family History  Problem Relation Age of Onset  . Hypertension Mother   . Diabetes Mother   . Stroke Mother   . Hypertension Father   . Cancer Father   . Seizures Father   . Hypertension Sister   . Diabetes Sister   . Hypertension Brother   . Diabetes Brother     Social History Reviewed with no changes to be made today.   Outpatient Medications Prior to Visit  Medication Sig Dispense Refill  . lamoTRIgine (LAMICTAL) 100 MG tablet Take 1 tablet (100 mg total) by mouth 2 (two) times daily. For mood stabilization 60 tablet 0  . levETIRAcetam (KEPPRA) 1000 MG tablet Take 1 tablet (1,000 mg total) by mouth 3 (three) times daily. For seizures 90 tablet 0  . furosemide (LASIX) 20 MG tablet Take 1 tablet (20 mg total) by mouth daily. (Patient not taking: Reported on 12/16/2017) 5 tablet 0  . hydrOXYzine (ATARAX/VISTARIL) 25 MG tablet Take 1 tablet (25 mg total) by mouth every 6 (six) hours as needed for itching or anxiety. (Patient not taking: Reported on 12/16/2017) 60 tablet 0  . risperiDONE (RISPERDAL) 2 MG tablet Take 1 tablet (2 mg total) by mouth at bedtime. For mood control (Patient not taking: Reported on 12/16/2017) 30 tablet 0  . traZODone (DESYREL) 50 MG tablet Take 1 tablet (  50 mg total) by mouth at bedtime as needed for sleep. (Patient not taking: Reported on 12/16/2017) 30 tablet 0   No facility-administered medications prior to visit.     No Known Allergies     Objective:    BP 139/81 (BP Location: Left Arm, Patient Position: Sitting, Cuff Size: Large)   Pulse 68   Temp 98.3 F (36.8 C) (Oral)   Ht 5\' 4"  (1.626 m)   Wt 230 lb (104.3 kg)   LMP 06/13/2011    SpO2 99%   BMI 39.48 kg/m  Wt Readings from Last 3 Encounters:  12/16/17 230 lb (104.3 kg)  10/10/17 218 lb (98.9 kg)  07/01/17 228 lb 3.2 oz (103.5 kg)    Physical Exam  Constitutional: She is oriented to person, place, and time. She appears well-developed and well-nourished.  HENT:  Head: Normocephalic and atraumatic.  Right Ear: External ear normal.  Left Ear: External ear normal.  Nose: Nose normal.  Mouth/Throat: Uvula is midline and oropharynx is clear and moist. Abnormal dentition. No oropharyngeal exudate.  Eyes: Pupils are equal, round, and reactive to light. Conjunctivae and EOM are normal. Right eye exhibits no discharge. No scleral icterus.  Neck: Normal range of motion. Neck supple. No tracheal deviation present. No thyromegaly present.  Cardiovascular: Normal rate, regular rhythm, normal heart sounds and intact distal pulses. Exam reveals no friction rub.  No murmur heard. Pulmonary/Chest: Effort normal and breath sounds normal. No accessory muscle usage. No respiratory distress. She has no decreased breath sounds. She has no wheezes. She has no rhonchi. She has no rales. She exhibits no tenderness. Right breast exhibits no inverted nipple, no mass, no nipple discharge, no skin change and no tenderness. Left breast exhibits no inverted nipple, no mass, no nipple discharge, no skin change and no tenderness. No breast swelling, tenderness, discharge or bleeding. Breasts are symmetrical.  Abdominal: Soft. Bowel sounds are normal. She exhibits no distension and no mass. There is no tenderness. There is no rebound and no guarding.  Musculoskeletal: Normal range of motion. She exhibits no edema, tenderness or deformity.  Feet:  Right Foot:  Protective Sensation: 10 sites tested. 10 sites sensed.  Left Foot:  Protective Sensation: 10 sites tested. 10 sites sensed.  Lymphadenopathy:    She has no cervical adenopathy.  Neurological: She is alert and oriented to person, place,  and time. She has normal reflexes. No cranial nerve deficit. Coordination normal.  Skin: Skin is warm and dry. No erythema.  Psychiatric: She has a normal mood and affect. Her speech is normal and behavior is normal. Judgment and thought content normal.       Patient has been counseled extensively about nutrition and exercise as well as the importance of adherence with medications and regular follow-up. The patient was given clear instructions to go to ER or return to medical center if symptoms don't improve, worsen or new problems develop. The patient verbalized understanding.   Follow-up: Return for Schedule for pap smear with me and ABI with nubea on same day. Claiborne Rigg, FNP-BC [facility] [place], [place] [phone]   12/17/2017, 11:14 PM

## 2017-12-17 ENCOUNTER — Encounter: Payer: Self-pay | Admitting: Nurse Practitioner

## 2017-12-19 ENCOUNTER — Telehealth (HOSPITAL_COMMUNITY): Payer: Self-pay

## 2017-12-19 DIAGNOSIS — R51 Headache: Secondary | ICD-10-CM | POA: Diagnosis not present

## 2017-12-19 DIAGNOSIS — G40109 Localization-related (focal) (partial) symptomatic epilepsy and epileptic syndromes with simple partial seizures, not intractable, without status epilepticus: Secondary | ICD-10-CM | POA: Diagnosis not present

## 2017-12-19 NOTE — Telephone Encounter (Signed)
Left a message to call [facility] °

## 2018-01-05 ENCOUNTER — Ambulatory Visit: Payer: Self-pay | Admitting: Nurse Practitioner

## 2018-01-05 ENCOUNTER — Other Ambulatory Visit (HOSPITAL_COMMUNITY): Payer: Self-pay | Admitting: *Deleted

## 2018-01-05 ENCOUNTER — Ambulatory Visit: Payer: Self-pay

## 2018-01-05 DIAGNOSIS — N6320 Unspecified lump in the left breast, unspecified quadrant: Secondary | ICD-10-CM

## 2018-01-13 ENCOUNTER — Other Ambulatory Visit: Payer: Self-pay | Admitting: Obstetrics and Gynecology

## 2018-01-13 ENCOUNTER — Ambulatory Visit (HOSPITAL_COMMUNITY)
Admission: RE | Admit: 2018-01-13 | Discharge: 2018-01-13 | Disposition: A | Discharge status: Home / Self Care | Payer: Self-pay | Source: Ambulatory Visit | Attending: Obstetrics and Gynecology | Admitting: Obstetrics and Gynecology

## 2018-01-13 ENCOUNTER — Ambulatory Visit: Payer: Self-pay

## 2018-01-13 ENCOUNTER — Encounter (HOSPITAL_COMMUNITY): Payer: Self-pay

## 2018-01-13 VITALS — BP 140/96

## 2018-01-13 DIAGNOSIS — Z1231 Encounter for screening mammogram for malignant neoplasm of breast: Secondary | ICD-10-CM

## 2018-01-13 DIAGNOSIS — Z1239 Encounter for other screening for malignant neoplasm of breast: Secondary | ICD-10-CM

## 2018-01-13 NOTE — Patient Instructions (Signed)
Explained breast self awareness with Kathleen Davis. Patient did not need a Pap smear today due to last Pap smear was in August 2018 per patient. Let her know [facility] will cover Pap smears every 3 years unless has a history of abnormal Pap smears. [facility]. Appointment scheduled for Tuesday, January 13, 2018 at 1210. Patient aware of appointment and will be there. Let patient know the Breast Center will follow up with her within the next couple weeks with results of mammogram by letter or phone. Kathleen Davis verbalized understanding.  Deniss Wormley, Kathaleen Maserhristine Poll, RN 12:54 PM

## 2018-01-13 NOTE — Progress Notes (Signed)
Patient stated she was referred for a left breast lump x 2 months. Patient stated she is unable to feel lump but it was felt on exam.   Reviewed patients previous exam 12/16/2017 that was benign.  Pap Smear: Pap smear not completed today. Last Pap smear was in August 2018 in [place]  and normal per patient. Per patient has no history of an abnormal Pap smear. Last Pap smear result is in Epic.  Physical exam: Breasts Right breast slightly larger than the left breast that per patient she has not noticed any changes. No skin abnormalities bilateral breasts. No nipple retraction bilateral breasts. No nipple discharge bilateral breasts. No lymphadenopathy. No lumps palpated bilateral breasts. No complaints of pain or tenderness on exam. [facility] mamm[facility]. Appointment scheduled for Tuesday, January 13, 2018 at 1210.        Pelvic/Bimanual No Pap smear completed today since last Pap smear and HPV typing was in August 2018 per patient. Pap smear not indicated per [facility] guidelines.   Smoking History: Patient is a former smoker that quit 2 days ago.  Patient Navigation: Patient education provided. Access to services provided for patient through [facility] program.    Colorectal Cancer Screening: Per patient has never had a colonoscopy completed. No complaints today. FIT Test given to patient to complete and return to [facility].  Breast and Cervical Cancer Risk Assessment: Patient has a family history of a sister and maternal aunt having breast cancer. Patient has no known genetic mutations or history of radiation treatment to the chest before age 52. Patient has no history of cervical dysplasia, immunocompromised, or DES exposure in-utero.  Risk Assessment    Risk Scores      01/13/2018   Last edited by: Lynnell DikeHolland, Sabrina H, LPN   5-year risk: 1.9 %   Lifetime risk: 13.3 %

## 2018-01-19 ENCOUNTER — Telehealth (HOSPITAL_COMMUNITY): Payer: Self-pay | Admitting: *Deleted

## 2018-01-19 ENCOUNTER — Encounter (HOSPITAL_COMMUNITY): Payer: Self-pay | Admitting: *Deleted

## 2018-01-19 NOTE — Telephone Encounter (Signed)
Telephoned patient at home number and advised patient would need to reschedule appointment for screening mammogram. [facility]. Patient voiced understanding.

## 2018-01-20 ENCOUNTER — Ambulatory Visit: Payer: Self-pay

## 2018-01-20 ENCOUNTER — Ambulatory Visit: Payer: Self-pay | Admitting: Nurse Practitioner

## 2018-02-28 ENCOUNTER — Emergency Department (HOSPITAL_COMMUNITY)
Admission: EM | Admit: 2018-02-28 | Discharge: 2018-02-28 | Disposition: A | Discharge status: Home / Self Care | Payer: Medicare Other | Attending: Emergency Medicine | Admitting: Emergency Medicine

## 2018-02-28 ENCOUNTER — Emergency Department (HOSPITAL_COMMUNITY): Payer: Medicare Other

## 2018-02-28 ENCOUNTER — Encounter (HOSPITAL_COMMUNITY): Payer: Self-pay

## 2018-02-28 DIAGNOSIS — R110 Nausea: Secondary | ICD-10-CM

## 2018-02-28 DIAGNOSIS — Z79899 Other long term (current) drug therapy: Secondary | ICD-10-CM | POA: Diagnosis not present

## 2018-02-28 DIAGNOSIS — N390 Urinary tract infection, site not specified: Secondary | ICD-10-CM | POA: Diagnosis not present

## 2018-02-28 DIAGNOSIS — K8020 Calculus of gallbladder without cholecystitis without obstruction: Secondary | ICD-10-CM | POA: Diagnosis not present

## 2018-02-28 DIAGNOSIS — I10 Essential (primary) hypertension: Secondary | ICD-10-CM | POA: Insufficient documentation

## 2018-02-28 DIAGNOSIS — R1032 Left lower quadrant pain: Secondary | ICD-10-CM

## 2018-02-28 DIAGNOSIS — R0602 Shortness of breath: Secondary | ICD-10-CM | POA: Diagnosis not present

## 2018-02-28 DIAGNOSIS — Z87891 Personal history of nicotine dependence: Secondary | ICD-10-CM | POA: Diagnosis not present

## 2018-02-28 DIAGNOSIS — R0789 Other chest pain: Secondary | ICD-10-CM | POA: Diagnosis not present

## 2018-02-28 LAB — CBC WITH DIFFERENTIAL/PLATELET
Abs Immature Granulocytes: 0.01 10*3/uL (ref 0.00–0.07)
BASOS ABS: 0 10*3/uL (ref 0.0–0.1)
Basophils Relative: 1 %
EOS ABS: 0.1 10*3/uL (ref 0.0–0.5)
EOS PCT: 1 %
HEMATOCRIT: 39 % (ref 36.0–46.0)
HEMOGLOBIN: 11.9 g/dL — AB (ref 12.0–15.0)
Immature Granulocytes: 0 %
LYMPHS ABS: 3.4 10*3/uL (ref 0.7–4.0)
LYMPHS PCT: 52 %
MCH: 29.6 pg (ref 26.0–34.0)
MCHC: 30.5 g/dL (ref 30.0–36.0)
MCV: 97 fL (ref 80.0–100.0)
MONO ABS: 0.5 10*3/uL (ref 0.1–1.0)
MONOS PCT: 7 %
Neutro Abs: 2.6 10*3/uL (ref 1.7–7.7)
Neutrophils Relative %: 39 %
Platelets: 358 10*3/uL (ref 150–400)
RBC: 4.02 MIL/uL (ref 3.87–5.11)
RDW: 14.1 % (ref 11.5–15.5)
WBC: 6.6 10*3/uL (ref 4.0–10.5)
nRBC: 0 % (ref 0.0–0.2)

## 2018-02-28 LAB — COMPREHENSIVE METABOLIC PANEL
ALBUMIN: 3.8 g/dL (ref 3.5–5.0)
ALK PHOS: 85 U/L (ref 38–126)
ALT: 14 U/L (ref 0–44)
ANION GAP: 8 (ref 5–15)
AST: 19 U/L (ref 15–41)
BILIRUBIN TOTAL: 0.5 mg/dL (ref 0.3–1.2)
BUN: 8 mg/dL (ref 6–20)
CALCIUM: 8.8 mg/dL — AB (ref 8.9–10.3)
CO2: 25 mmol/L (ref 22–32)
Chloride: 107 mmol/L (ref 98–111)
Creatinine, Ser: 0.91 mg/dL (ref 0.44–1.00)
GFR calc Af Amer: >60 mL/min (ref >60)
GLUCOSE: 94 mg/dL (ref 70–99)
Potassium: 3.7 mmol/L (ref 3.5–5.1)
Sodium: 140 mmol/L (ref 135–145)
TOTAL PROTEIN: 7 g/dL (ref 6.5–8.1)

## 2018-02-28 LAB — I-STAT TROPONIN, ED
Troponin i, poc: 0 ng/mL (ref 0.00–0.08)
Troponin i, poc: 0 ng/mL (ref 0.00–0.08)

## 2018-02-28 LAB — LIPASE, BLOOD: LIPASE: 33 U/L (ref 11–51)

## 2018-02-28 LAB — URINALYSIS, ROUTINE W REFLEX MICROSCOPIC
Bilirubin Urine: NEGATIVE
GLUCOSE, UA: NEGATIVE mg/dL
Ketones, ur: NEGATIVE mg/dL
NITRITE: NEGATIVE
PH: 6 (ref 5.0–8.0)
Protein, ur: NEGATIVE mg/dL
SPECIFIC GRAVITY, URINE: 1.008 (ref 1.005–1.030)

## 2018-02-28 LAB — I-STAT BETA HCG BLOOD, ED (MC, WL, AP ONLY): I-stat hCG, quantitative: <5 m[IU]/mL (ref <5)

## 2018-02-28 MED ORDER — IOHEXOL 300 MG/ML  SOLN
100.0000 mL | Freq: Once | INTRAMUSCULAR | Status: AC | PRN
  Administered 2018-02-28: 100 mL via INTRAVENOUS

## 2018-02-28 MED ORDER — SODIUM CHLORIDE 0.9 % IV BOLUS
1000.0000 mL | Freq: Once | INTRAVENOUS | Status: AC
  Administered 2018-02-28: 1000 mL via INTRAVENOUS

## 2018-02-28 MED ORDER — ONDANSETRON HCL 4 MG/2ML IJ SOLN
4.0000 mg | Freq: Once | INTRAMUSCULAR | Status: AC
  Administered 2018-02-28: 4 mg via INTRAVENOUS
  Filled 2018-02-28: qty 2

## 2018-02-28 MED ORDER — MORPHINE SULFATE (PF) 4 MG/ML IV SOLN
4.0000 mg | Freq: Once | INTRAVENOUS | Status: AC
  Administered 2018-02-28: 4 mg via INTRAVENOUS
  Filled 2018-02-28: qty 1

## 2018-02-28 MED ORDER — ONDANSETRON 4 MG PO TBDP: 4.0000 mg | ORAL_TABLET | Freq: Three times a day (TID) | ORAL | 0 refills | Status: DC | PRN

## 2018-02-28 MED ORDER — CEPHALEXIN 500 MG PO CAPS: ORAL_CAPSULE | ORAL | 0 refills | Status: DC

## 2018-02-28 NOTE — ED Notes (Signed)
Second RN attempted IV stick

## 2018-02-28 NOTE — ED Triage Notes (Signed)
Attempted IV site on Lt [facility]

## 2018-02-28 NOTE — ED Notes (Signed)
PO challenging pt.

## 2018-02-28 NOTE — Discharge Instructions (Signed)
Stay very well hydrated with plenty of water throughout the day. Use zofran as directed as needed for nausea. Take antibiotic until completed. Alternate between tylenol and motrin as needed for pain. Follow up with your primary care physician in 1 week for recheck of ongoing symptoms but return to ER for emergent changing or worsening of symptoms. Please seek immediate care if you develop the following: You develop back pain.  Your symptoms are no better, or worse in 3 days. There is severe back pain or lower abdominal pain.  You develop chills.  You have a fever.  There is nausea or vomiting.  There is continued burning or discomfort with urination.

## 2018-02-28 NOTE — ED Notes (Signed)
Patient transported to X-ray 

## 2018-02-28 NOTE — ED Triage Notes (Signed)
Abdominal pain x1 mo, describes as getting "heavier," intermittent CP and L sided shoulder and back pain, no SOB, some nasuea. Abdominal pain 8/10

## 2018-02-28 NOTE — ED Notes (Signed)
This RN attempted IV start x2 

## 2018-02-28 NOTE — ED Provider Notes (Signed)
[facility] EMERGENCY DEPARTMENT Provider Note   CSN: [id number] Arrival date & time: 02/28/18  1415     History   Chief Complaint Chief Complaint  Patient presents with  . Abdominal Pain  . Shoulder Pain  . Chest Pain    HPI Kathleen Davis is a 53 y.o. female with a PMHx of HTN, seizures, headaches, and depression, who presents to the ED with complaints of abdominal pain that began initially 2 months ago but got better until 2 weeks ago when it returned and became more constant.  She describes this pain as 8/10 constant aching LLQ pain that occasionally radiates to the RLQ, with no known aggravating factors, and unrelieved with ibuprofen.  She reports associated nausea.  She also mentions that for the last 2 weeks she has had intermittent left shoulder pain that radiates into the left side of her chest, feels like a sharp pain.  She stated that the reason she came in today was her abdominal pain, she was most concerned with this.  Positive family history of MI in her mother and father.  She smokes Black and milds but does not smoke cigarettes.  She denies any lightheadedness, diaphoresis, fevers, chills, cough, shortness of breath, leg swelling, recent travel/surgery/immobilization, estrogen use, personal or family history of DVT/PE, vomiting, diarrhea, constipation, melena, hematochezia, dysuria, hematuria, vaginal bleeding or discharge, numbness, tingling, focal weakness, claudication, orthopnea, or any other complaints at this time.  She denies any recent sick contacts, suspicious food intake, alcohol use, or frequent NSAID use.  The history is provided by the patient and medical records. No language interpreter was used.  Abdominal Pain  Associated symptoms: chest pain and nausea   Associated symptoms: no chills, no constipation, no cough, no diarrhea, no dysuria, no fever, no hematuria, no shortness of breath, no vaginal bleeding, no vaginal discharge and no vomiting     Shoulder Pain  Associated symptoms: no fever   Chest Pain  Associated symptoms: abdominal pain and nausea   Associated symptoms: no cough, no diaphoresis, no fever, no numbness, no shortness of breath, no vomiting and no weakness     Past Medical History:  Diagnosis Date  . Chronic headaches   . Depression   . Hypertension   . Seizures [facility])     Patient Active Problem List   Diagnosis Date Noted  . 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 SEIZURE DISORDER 04/02/2006    Past Surgical History:  Procedure Laterality Date  . TUBAL LIGATION       OB History    Gravida  4   Para      Term      Preterm      AB      Living  4     SAB      TAB      Ectopic      Multiple      Live Births  4            Home Medications    Prior to Admission medications   Medication Sig Start Date End Date Taking? Authorizing Provider  furosemide (LASIX) 20 MG tablet Take 1 tablet (20 mg total) by mouth daily. Patient not taking: Reported on 12/16/2017 10/29/17   Horton, Mayer Masker, MD  hydrOXYzine (ATARAX/VISTARIL) 25 MG tablet Take 1 tablet (25 mg total) by mouth every 6 (six) hours as needed for itching or anxiety. Patient not taking: Reported on 12/16/2017  08/01/17   Armandina Stammer I, NP  lamoTRIgine (LAMICTAL) 100 MG tablet Take 1 tablet (100 mg total) by mouth 2 (two) times daily. For mood stabilization 08/01/17   Armandina Stammer I, NP  levETIRAcetam (KEPPRA) 1000 MG tablet Take 1 tablet (1,000 mg total) by mouth 3 (three) times daily. For seizures 08/01/17   Armandina Stammer I, NP  risperiDONE (RISPERDAL) 2 MG tablet Take 1 tablet (2 mg total) by mouth at bedtime. For mood control Patient not taking: Reported on 12/16/2017 08/01/17   Armandina Stammer I, NP  traZODone (DESYREL) 50 MG tablet Take 1 tablet (50 mg total) by mouth at bedtime as needed for sleep. Patient not taking: Reported on 12/16/2017 08/01/17   Sanjuana Kava, NP    Family  History Family History  Problem Relation Age of Onset  . Hypertension Mother   . Diabetes Mother   . Stroke Mother   . Hypertension Father   . Cancer Father   . Seizures Father   . Hypertension Sister   . Diabetes Sister   . Breast cancer Sister   . Hypertension Brother   . Diabetes Brother     Social History Social History   Tobacco Use  . Smoking status: Former Smoker    Types: Cigarettes  . Smokeless tobacco: Never Used  . Tobacco comment: quit about 3 months ago  Substance Use Topics  . Alcohol use: Never    Frequency: Never  . Drug use: Never     Allergies   Patient has no known allergies.   Review of Systems Review of Systems  Constitutional: Negative for chills, diaphoresis and fever.  Respiratory: Negative for cough and shortness of breath.   Cardiovascular: Positive for chest pain. Negative for leg swelling.  Gastrointestinal: Positive for abdominal pain and nausea. Negative for blood in stool, constipation, diarrhea and vomiting.  Genitourinary: Negative for dysuria, hematuria, vaginal bleeding and vaginal discharge.  Musculoskeletal: Positive for arthralgias. Negative for myalgias.  Skin: Negative for color change.  Allergic/Immunologic: Negative for immunocompromised state.  Neurological: Negative for weakness, light-headedness and numbness.  Psychiatric/Behavioral: Negative for confusion.   All other systems reviewed and are negative for acute change except as noted in the HPI.    Physical Exam Updated Vital Signs BP (!) 148/88 (BP Location: Right Arm)   Pulse 93   Temp 97.9 F (36.6 C) (Oral)   Resp 14   Ht 5\' 4"  (1.626 m)   Wt 76.2 kg   LMP 06/13/2011   SpO2 99%   BMI 28.84 kg/m   Physical Exam Vitals signs and nursing note reviewed.  Constitutional:      General: She is not in acute distress.    Appearance: Normal appearance. She is well-developed. She is not toxic-appearing.     Comments: Afebrile, nontoxic, NAD  HENT:     Head:  Normocephalic and atraumatic.  Eyes:     General:        Right eye: No discharge.        Left eye: No discharge.     Conjunctiva/sclera: Conjunctivae normal.  Neck:     Musculoskeletal: Normal range of motion and neck supple.  Cardiovascular:     Rate and Rhythm: Normal rate and regular rhythm.     Pulses: Normal pulses.     Heart sounds: Normal heart sounds, S1 normal and S2 normal. No murmur. No friction rub. No gallop.      Comments: RRR, nl s1/s2, no m/r/g, distal pulses intact, no pedal  edema  Pulmonary:     Effort: Pulmonary effort is normal. No respiratory distress.     Breath sounds: Normal breath sounds. No decreased breath sounds, wheezing, rhonchi or rales.     Comments: CTAB in all lung fields, no w/r/r, no hypoxia or increased WOB, speaking in full sentences, SpO2 99% on RA  Chest:     Chest wall: No deformity, tenderness or crepitus.     Comments: Chest wall nonTTP without crepitus, deformities, or retractions  Abdominal:     General: Bowel sounds are normal. There is no distension.     Palpations: Abdomen is soft. Abdomen is not rigid.     Tenderness: There is abdominal tenderness in the left lower quadrant. There is no right CVA tenderness, left CVA tenderness, guarding or rebound. Negative signs include Murphy's sign and McBurney's sign.     Comments: Soft, nondistended, +BS throughout, with mild LLQ TTP, no r/g/r, neg murphy's, neg mcburney's, no CVA TTP   Musculoskeletal: Normal range of motion.  Skin:    General: Skin is warm and dry.     Findings: No rash.  Neurological:     Mental Status: She is alert and oriented to person, place, and time.     Sensory: Sensation is intact. No sensory deficit.     Motor: Motor function is intact.  Psychiatric:        Mood and Affect: Mood and affect normal.        Behavior: Behavior normal.      ED Treatments / Results  Labs (all labs ordered are listed, but only abnormal results are displayed) Labs Reviewed  CBC  WITH DIFFERENTIAL/PLATELET - Abnormal; Notable for the following components:      Result Value   Hemoglobin 11.9 (*)    All other components within normal limits  COMPREHENSIVE METABOLIC PANEL - Abnormal; Notable for the following components:   Calcium 8.8 (*)    All other components within normal limits  URINALYSIS, ROUTINE W REFLEX MICROSCOPIC - Abnormal; Notable for the following components:   APPearance HAZY (*)    Hgb urine dipstick MODERATE (*)    Leukocytes, UA LARGE (*)    Bacteria, UA FEW (*)    All other components within normal limits  URINE CULTURE  LIPASE, BLOOD  I-STAT BETA HCG BLOOD, ED ([facility], [facility], [facility] ONLY)  I-STAT TROPONIN, ED  I-STAT TROPONIN, ED    EKG EKG Interpretation  Date/Time:  Saturday February 28 2018 14:27:30 EST Ventricular Rate:  99 PR Interval:    QRS Duration: 92 QT Interval:  344 QTC Calculation: 442 R Axis:   78 Text Interpretation:  Sinus tachycardia Atrial premature complex Consider left ventricular hypertrophy No significant change since last tracing Confirmed by Alvira Monday ([id number]) on 02/28/2018 2:50:02 PM   Radiology Dg Chest 2 View  Result Date: 02/28/2018 CLINICAL DATA:  Chest pain, shortness of breath. EXAM: CHEST - 2 VIEW COMPARISON:  Radiographs and CT scan of October 28, 2017. FINDINGS: The heart size and mediastinal contours are within normal limits. Both lungs are clear. No pneumothorax or pleural effusion is noted. The visualized skeletal structures are unremarkable. IMPRESSION: No active cardiopulmonary disease. Electronically Signed   By: Lupita Raider, M.D.   On: 02/28/2018 15:09   Ct Abdomen Pelvis W Contrast  Result Date: 02/28/2018 CLINICAL DATA:  Abdominal pain for 1 month. Suspected diverticulitis. EXAM: CT ABDOMEN AND PELVIS WITH CONTRAST TECHNIQUE: Multidetector CT imaging of the abdomen and pelvis was performed using the standard  protocol following bolus administration of intravenous contrast. CONTRAST:  100mL  OMNIPAQUE IOHEXOL 300 MG/ML  SOLN COMPARISON:  06/24/2011 FINDINGS: Lower Chest: No acute findings. Hepatobiliary: No hepatic masses identified. Gallstones are seen, however there is no evidence of cholecystitis or biliary dilatation. Pancreas:  No mass or inflammatory changes. Spleen: Within normal limits in size and appearance. Adrenals/Urinary Tract: No masses identified. No evidence of hydronephrosis. Unremarkable unopacified urinary bladder. Stomach/Bowel: No evidence of obstruction, inflammatory process or abnormal fluid collections. Normal appendix visualized. Vascular/Lymphatic: No pathologically enlarged lymph nodes. No abdominal aortic aneurysm. Reproductive:  No mass or other significant abnormality. Other:  None. Musculoskeletal:  No suspicious bone lesions identified. IMPRESSION: 1. No evidence of diverticulitis or other acute findings. 2. Cholelithiasis. No radiographic evidence of cholecystitis. Electronically Signed   By: Myles RosenthalJohn  Stahl M.D.   On: 02/28/2018 17:52    Procedures Procedures (including critical care time)  Medications Ordered in ED Medications  sodium chloride 0.9 % bolus 1,000 mL (0 mLs Intravenous Stopped 02/28/18 1940)  morphine 4 MG/ML injection 4 mg (4 mg Intravenous Given 02/28/18 1654)  ondansetron (ZOFRAN) injection 4 mg (4 mg Intravenous Given 02/28/18 1654)  iohexol (OMNIPAQUE) 300 MG/ML solution 100 mL (100 mLs Intravenous Contrast Given 02/28/18 1710)     Initial Impression / Assessment and Plan / ED Course  I have reviewed the triage vital signs and the nursing notes.  Pertinent labs & imaging results that were available during my care of the patient were reviewed by me and considered in my medical decision making (see chart for details).     53 y.o. female here with left lower quadrant pain that began 2 months ago but got better until 2 weeks ago when it returned.  She also complains of left shoulder pain that radiates into her left chest that occurs  intermittently for the last 2 weeks.  On exam, no chest wall tenderness, no pedal edema, no tachycardia or hypoxia, mild LLQ tenderness but non-peritoneal.  We will get labs, EKG, chest x-ray, and CT abdomen pelvis to further evaluate her symptoms.  Will give pain medicine and nausea medicine and reassess shortly. Discussed case with my attending Dr. Dalene SeltzerSchlossman who agrees with plan.   5:59 PM CBC w/diff with chronic stable anemia. CMP WNL. Lipase WNL. U/A with large leuks, 0-5 squamous, 11-20 WBCs, and few bacteria, could be c/w UTI, will send for UCx. BetaHCG neg. Trop neg. EKG nonischemic. CXR negative for acute findings. CT abd/pelv without acute findings to explain her symptoms. Overall, could be UTI causing the belly pain. Will get delta trop at 3hr mark, and reassess. Pt feeling better, will PO challenge.   8:00 PM Delta trop neg. Pt feeling better and tolerating PO well. Overall reassuring exam, doubt need for further emergent work up or intervention. Will send home with abx for UTI. Advised tylenol/motrin for pain, and f/up with PCP in 1wk. I explained the diagnosis and have given explicit precautions to return to the ER including for any other new or worsening symptoms. The patient understands and accepts the medical plan as it's been dictated and I have answered their questions. Discharge instructions concerning home care and prescriptions have been given. The patient is STABLE and is discharged to home in good condition.    Final Clinical Impressions(s) / ED Diagnoses   Final diagnoses:  LLQ pain  Lower urinary tract infectious disease  Atypical chest pain  Nausea    ED Discharge Orders  Ordered    cephALEXin (KEFLEX) 500 MG capsule     02/28/18 1959    ondansetron (ZOFRAN ODT) 4 MG disintegrating tablet  Every 8 hours PRN     02/28/18 [address] [place], [place] 02/28/18 Eustace Moore, MD 03/02/18 2231

## 2018-03-02 LAB — URINE CULTURE

## 2018-03-18 ENCOUNTER — Encounter (HOSPITAL_COMMUNITY): Payer: Self-pay | Admitting: *Deleted

## 2018-03-18 NOTE — Progress Notes (Signed)
Letter mailed to patient to schedule mammogram.

## 2018-03-23 ENCOUNTER — Encounter (HOSPITAL_COMMUNITY): Payer: Self-pay | Admitting: *Deleted

## 2018-03-23 ENCOUNTER — Emergency Department (HOSPITAL_COMMUNITY)
Admission: EM | Admit: 2018-03-23 | Discharge: 2018-03-23 | Disposition: A | Discharge status: Home / Self Care | Payer: Medicare Other | Attending: Emergency Medicine | Admitting: Emergency Medicine

## 2018-03-23 DIAGNOSIS — Z79899 Other long term (current) drug therapy: Secondary | ICD-10-CM | POA: Insufficient documentation

## 2018-03-23 DIAGNOSIS — Y929 Unspecified place or not applicable: Secondary | ICD-10-CM | POA: Insufficient documentation

## 2018-03-23 DIAGNOSIS — Y939 Activity, unspecified: Secondary | ICD-10-CM | POA: Diagnosis not present

## 2018-03-23 DIAGNOSIS — S40021A Contusion of right upper arm, initial encounter: Secondary | ICD-10-CM

## 2018-03-23 DIAGNOSIS — X58XXXA Exposure to other specified factors, initial encounter: Secondary | ICD-10-CM | POA: Insufficient documentation

## 2018-03-23 DIAGNOSIS — S40022A Contusion of left upper arm, initial encounter: Secondary | ICD-10-CM | POA: Diagnosis not present

## 2018-03-23 DIAGNOSIS — I10 Essential (primary) hypertension: Secondary | ICD-10-CM | POA: Diagnosis not present

## 2018-03-23 DIAGNOSIS — G40909 Epilepsy, unspecified, not intractable, without status epilepticus: Secondary | ICD-10-CM | POA: Diagnosis not present

## 2018-03-23 DIAGNOSIS — G40409 Other generalized epilepsy and epileptic syndromes, not intractable, without status epilepticus: Secondary | ICD-10-CM | POA: Diagnosis not present

## 2018-03-23 DIAGNOSIS — R569 Unspecified convulsions: Secondary | ICD-10-CM

## 2018-03-23 DIAGNOSIS — Z87891 Personal history of nicotine dependence: Secondary | ICD-10-CM | POA: Diagnosis not present

## 2018-03-23 DIAGNOSIS — Y999 Unspecified external cause status: Secondary | ICD-10-CM | POA: Insufficient documentation

## 2018-03-23 DIAGNOSIS — R079 Chest pain, unspecified: Secondary | ICD-10-CM | POA: Diagnosis not present

## 2018-03-23 LAB — URINALYSIS, ROUTINE W REFLEX MICROSCOPIC
Bilirubin Urine: NEGATIVE
Glucose, UA: NEGATIVE mg/dL
Ketones, ur: 5 mg/dL — AB
Nitrite: NEGATIVE
Protein, ur: 30 mg/dL — AB
Specific Gravity, Urine: 1.026 (ref 1.005–1.030)
pH: 6 (ref 5.0–8.0)

## 2018-03-23 LAB — CBC WITH DIFFERENTIAL/PLATELET
Abs Immature Granulocytes: 0.02 10*3/uL (ref 0.00–0.07)
Basophils Absolute: 0 10*3/uL (ref 0.0–0.1)
Basophils Relative: 0 %
Eosinophils Absolute: 0.1 10*3/uL (ref 0.0–0.5)
Eosinophils Relative: 1 %
HEMATOCRIT: 38.6 % (ref 36.0–46.0)
Hemoglobin: 11.6 g/dL — ABNORMAL LOW (ref 12.0–15.0)
IMMATURE GRANULOCYTES: 0 %
Lymphocytes Relative: 43 %
Lymphs Abs: 3.8 10*3/uL (ref 0.7–4.0)
MCH: 29.6 pg (ref 26.0–34.0)
MCHC: 30.1 g/dL (ref 30.0–36.0)
MCV: 98.5 fL (ref 80.0–100.0)
Monocytes Absolute: 0.7 10*3/uL (ref 0.1–1.0)
Monocytes Relative: 8 %
Neutro Abs: 4.2 10*3/uL (ref 1.7–7.7)
Neutrophils Relative %: 48 %
Platelets: 370 10*3/uL (ref 150–400)
RBC: 3.92 MIL/uL (ref 3.87–5.11)
RDW: 14.3 % (ref 11.5–15.5)
WBC: 8.8 10*3/uL (ref 4.0–10.5)
nRBC: 0 % (ref 0.0–0.2)

## 2018-03-23 LAB — BASIC METABOLIC PANEL
Anion gap: 14 (ref 5–15)
BUN: 10 mg/dL (ref 6–20)
CO2: 21 mmol/L — ABNORMAL LOW (ref 22–32)
Calcium: 9.1 mg/dL (ref 8.9–10.3)
Chloride: 110 mmol/L (ref 98–111)
Creatinine, Ser: 0.91 mg/dL (ref 0.44–1.00)
GFR calc non Af Amer: >60 mL/min (ref >60)
Glucose, Bld: 99 mg/dL (ref 70–99)
Potassium: 4.2 mmol/L (ref 3.5–5.1)
Sodium: 145 mmol/L (ref 135–145)

## 2018-03-23 LAB — RAPID URINE DRUG SCREEN, HOSP PERFORMED
Amphetamines: NOT DETECTED
Barbiturates: NOT DETECTED
Benzodiazepines: NOT DETECTED
Cocaine: NOT DETECTED
Opiates: NOT DETECTED
Tetrahydrocannabinol: NOT DETECTED

## 2018-03-23 MED ORDER — LORAZEPAM 2 MG/ML IJ SOLN
2.0000 mg | Freq: Once | INTRAMUSCULAR | Status: AC
  Administered 2018-03-23: 2 mg via INTRAVENOUS

## 2018-03-23 MED ORDER — LEVETIRACETAM IN NACL 1000 MG/100ML IV SOLN
1000.0000 mg | Freq: Once | INTRAVENOUS | Status: AC
  Administered 2018-03-23: 1000 mg via INTRAVENOUS
  Filled 2018-03-23: qty 100

## 2018-03-23 MED ORDER — LEVETIRACETAM IN NACL 500 MG/100ML IV SOLN: 500.0000 mg | Freq: Once | INTRAVENOUS | Status: DC

## 2018-03-23 MED ORDER — LORAZEPAM 2 MG/ML IJ SOLN
1.0000 mg | Freq: Once | INTRAMUSCULAR | Status: AC
  Administered 2018-03-23: 1 mg via INTRAVENOUS
  Filled 2018-03-23: qty 1

## 2018-03-23 MED ORDER — LORAZEPAM 2 MG/ML IJ SOLN
INTRAMUSCULAR | Status: AC
  Filled 2018-03-23: qty 1

## 2018-03-23 NOTE — ED Notes (Signed)
2 mg ativan pulled from pyxis. 1mg  ordered to push IV. Iv pulled out while pushing. MD ordered to start new IV and push other 1mg  of ativan left over. Nothing to waste in pyxis.

## 2018-03-23 NOTE — ED Provider Notes (Signed)
[facility] Provider Note   CSN: [id number] Arrival date & time: 03/23/18  1452     History   Chief Complaint Chief Complaint  Patient presents with  . Arm Pain    HPI Kathleen Davis is a 53 y.o. female with PMH grand mal seizures, HTN, and chronic headaches who presented for bruising of the LUE. Patient was in triage when she had a seizure. She did not fall or hit her head. Seizure was reportedly grand mal and lasted about one minute. Patient woke up in post-ictal state. Per chart review seizures occur 1-2x month.    HPI  Past Medical History:  Diagnosis Date  . Chronic headaches   . Depression   . Hypertension   . Seizures [facility](HCC)     Patient Active Problem List   Diagnosis Date Noted  . 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 SEIZURE DISORDER 04/02/2006    Past Surgical History:  Procedure Laterality Date  . TUBAL LIGATION       OB History    Gravida  4   Para      Term      Preterm      AB      Living  4     SAB      TAB      Ectopic      Multiple      Live Births  4            Home Medications    Prior to Admission medications   Medication Sig Start Date End Date Taking? Authorizing Provider  cephALEXin (KEFLEX) 500 MG capsule 2 caps po bid x 7 days 02/28/18   Street, [place], PA-C  furosemide (LASIX) 20 MG tablet Take 1 tablet (20 mg total) by mouth daily. Patient not taking: Reported on 12/16/2017 10/29/17   Horton, Mayer Maskerourtney F, MD  hydrOXYzine (ATARAX/VISTARIL) 25 MG tablet Take 1 tablet (25 mg total) by mouth every 6 (six) hours as needed for itching or anxiety. Patient not taking: Reported on 12/16/2017 08/01/17   Armandina StammerNwoko, Agnes I, NP  ibuprofen (ADVIL,MOTRIN) 200 MG tablet Take 400 mg by mouth every 6 (six) hours as needed for headache (pain).    [provider]  lamoTRIgine (LAMICTAL) 100 MG tablet Take 1 tablet (100 mg total) by mouth 2 (two)  times daily. For mood stabilization Patient not taking: Reported on 02/28/2018 08/01/17   Armandina StammerNwoko, Agnes I, NP  levETIRAcetam (KEPPRA) 1000 MG tablet Take 1 tablet (1,000 mg total) by mouth 3 (three) times daily. For seizures Patient not taking: Reported on 02/28/2018 08/01/17   Armandina StammerNwoko, Agnes I, NP  ondansetron (ZOFRAN ODT) 4 MG disintegrating tablet Take 1 tablet (4 mg total) by mouth every 8 (eight) hours as needed for nausea or vomiting. 02/28/18   Street, [place], PA-C  OVER THE COUNTER MEDICATION Place 2 drops into both eyes 2 (two) times daily.    [provider]  risperiDONE (RISPERDAL) 2 MG tablet Take 1 tablet (2 mg total) by mouth at bedtime. For mood control Patient not taking: Reported on 12/16/2017 08/01/17   Armandina StammerNwoko, Agnes I, NP  traZODone (DESYREL) 50 MG tablet Take 1 tablet (50 mg total) by mouth at bedtime as needed for sleep. Patient not taking: Reported on 12/16/2017 08/01/17   Sanjuana KavaNwoko, Agnes I, NP    Family History Family History  Problem Relation Age of Onset  . Hypertension Mother   .  Diabetes Mother   . Stroke Mother   . Hypertension Father   . Cancer Father   . Seizures Father   . Hypertension Sister   . Diabetes Sister   . Breast cancer Sister   . Hypertension Brother   . Diabetes Brother     Social History Social History   Tobacco Use  . Smoking status: Former Smoker    Types: Cigarettes  . Smokeless tobacco: Never Used  . Tobacco comment: quit about 3 months ago  Substance Use Topics  . Alcohol use: Never    Frequency: Never  . Drug use: Never     Allergies   Patient has no known allergies.   Review of Systems Review of Systems  Constitutional: Negative for fatigue.  HENT: Negative for facial swelling and sinus pain.   Respiratory: Negative for chest tightness and shortness of breath.   Cardiovascular: Positive for chest pain (states this is mild and normal after her seizures). Negative for palpitations and leg swelling.  Gastrointestinal:  Negative for abdominal pain.  Musculoskeletal: Negative for arthralgias, back pain and neck pain.  Neurological: Positive for dizziness, seizures and headaches (chronic). Negative for tremors, speech difficulty and numbness.  Psychiatric/Behavioral: Negative for agitation, confusion, dysphoric mood and hallucinations. The patient is not hyperactive.   All other systems reviewed and are negative.    Physical Exam Updated Vital Signs BP 111/62 (BP Location: Right Arm)   Pulse 69   Temp 97.9 F (36.6 C) (Oral)   Resp 15   LMP 06/13/2011   SpO2 100%   Physical Exam Constitution: NAD, lying supine in bed, pleasant HENT: [place]/AT Eyes: eom intact, no scleral icterus, PERRLA Cardio: tachycardic, regular rhythm, no m/r/g Respiratory: non-labored breathing Abdominal: NTTP, non-distended  MSK: moving all extremities, strength 5/5 bilaterally  Neuro: a&ox3, CN II-XII normal  Skin: LUE hematoma, otherwise skin c/d/i    ED Treatments / Results  Labs (all labs ordered are listed, but only abnormal results are displayed) Labs Reviewed  CBC WITH DIFFERENTIAL/PLATELET - Abnormal; Notable for the following components:      Result Value   Hemoglobin 11.6 (*)    All other components within normal limits  URINALYSIS, ROUTINE W REFLEX MICROSCOPIC - Abnormal; Notable for the following components:   APPearance CLOUDY (*)    Hgb urine dipstick SMALL (*)    Ketones, ur 5 (*)    Protein, ur 30 (*)    Leukocytes, UA LARGE (*)    Bacteria, UA FEW (*)    All other components within normal limits  BASIC METABOLIC PANEL - Abnormal; Notable for the following components:   CO2 21 (*)    All other components within normal limits  RAPID URINE DRUG SCREEN, HOSP PERFORMED    EKG None  Radiology No results found.  Procedures Procedures (including critical care time)  Medications Ordered in ED Medications  LORazepam (ATIVAN) injection 2 mg (2 mg Intravenous Given 03/23/18 1614)  LORazepam (ATIVAN)  injection 1 mg (1 mg Intravenous Given 03/23/18 1638)  levETIRAcetam (KEPPRA) IVPB 1000 mg/100 mL premix (0 mg Intravenous Stopped 03/23/18 1714)     Initial Impression / Assessment and Plan / ED Course  I have reviewed the triage vital signs and the nursing notes.  Pertinent labs & imaging results that were available during my care of the patient were reviewed by me and considered in my medical decision making (see chart for details).  Clinical Course as of Mar 24 2256  [facility] Mar 23, 2018  1639 History of seizures. Here today for LUE bruising that she is unsure of source, had seizure while in triage. She normally takes Keppra 1,000 tid and lamictal bid. Will give Keppra 1,000mg  tid. Received 3mg  IV ativan.    [JS]  1725 Patient alert and oriented on return to room. States she has seizures 1-2x month. She does not drive. She has not been taking her seizure medications due to issues with insurance. She states she now would be able to pick them up. She lives at home with her sister who can take her to the pharmacy. She states she also has follow-up with neurology next month. Discussed the importance of continuing her seizure medications. Physical and CN exam benign. She is stable for discharge and will need to pick cont. Her Keppra and lamictal.    [JS]    Clinical Course User Index [JS] Yifan Auker A, DO     Final Clinical Impressions(s) / ED Diagnoses   Final diagnoses:  Arm bruise, right, initial encounter  Grand mal seizure (HCC)  Hematoma of arm, left, initial encounter  Seizure [facility](HCC)    ED Discharge Orders    None       Lonie Newsham A, DO 03/24/18 2258    Gwyneth SproutPlunkett, Whitney, MD 03/25/18 2027

## 2018-03-23 NOTE — ED Triage Notes (Signed)
Pt in c/o area of redness and swelling to her right upper arm, first noticed the area this morning, denies injury or bite that she knows of

## 2018-03-23 NOTE — ED Notes (Signed)
Pt had witnessed tonic clonic seizure that lasted around one minute. Pt postictal after Ativan was administered. Pt unaware of seizure and is confused.

## 2018-03-23 NOTE — ED Provider Notes (Signed)
Kathleen Davis is a 53 y.o. female with a history of seizures, hypertension, chronic headaches and depression, who presented initially to the emergency department reporting pain over her right upper arm with an area of redness that she noticed this morning.  She reports the area is mildly tender to palpation but she does not have any warmth, no fevers or chills.  She denies any pain in the shoulder or elbow.  Unsure of any trauma or injury to the area.  Does not think she had any scratch or bite to the area either.  Took some Tylenol with some improvement in pain has not done anything else to treat the symptoms.  No associated chest pain or shortness of breath.  Reports she has some intermittent lower extremity swelling but this is chronic and unchanged, has had some pain behind the right knee but this is been going on for over 2 years and is unchanged as well.  On exam patient noted to have bruise to the right upper arm as noted in photo below there is some mild soft tissue swelling but no obvious deformity and patient has normal range of motion at the shoulder and elbow.    Shortly after leaving the room, was pulled and after patient began having a tonic-clonic seizure, seizure lasted approximately 1 minute, she was given 2 mg of Ativan as she was coming out of the seizure.  After seizure she remains postictal but is eye-opening and tracking has not yet been verbal but has no further evidence of seizure activity.  Patient does appear to have had an episode of incontinence.  On chart review patient has a history of tonic-clonic seizures and typically takes Keppra and Lamictal for these.  Patient will be moved back to an acute bed for further evaluation of seizures, patient has had IV access obtained is satting well post seizure.  Initial laboratory orders placed and care discussed with Dr. Anitra Lauth who will continue to see and evaluate the patient.   Kathlynn, [place], PA-C 03/23/18 1644    Margarita Grizzle,  MD 03/27/18 [phone]

## 2018-03-23 NOTE — Discharge Instructions (Addendum)
Please pick up your prescription seizure medications and continue these to decrease recurrence of your chronic seizures.  Please make sure to follow-up with your neurologist.

## 2018-04-01 ENCOUNTER — Ambulatory Visit: Payer: Medicare Other | Attending: Nurse Practitioner | Admitting: Nurse Practitioner

## 2018-04-01 ENCOUNTER — Encounter: Payer: Self-pay | Admitting: Nurse Practitioner

## 2018-04-01 VITALS — BP 157/93 | HR 72 | Temp 98.5°F | Ht 64.0 in | Wt 217.2 lb

## 2018-04-01 DIAGNOSIS — Z833 Family history of diabetes mellitus: Secondary | ICD-10-CM | POA: Insufficient documentation

## 2018-04-01 DIAGNOSIS — F5101 Primary insomnia: Secondary | ICD-10-CM | POA: Diagnosis not present

## 2018-04-01 DIAGNOSIS — Z8249 Family history of ischemic heart disease and other diseases of the circulatory system: Secondary | ICD-10-CM | POA: Diagnosis not present

## 2018-04-01 DIAGNOSIS — Z1231 Encounter for screening mammogram for malignant neoplasm of breast: Secondary | ICD-10-CM

## 2018-04-01 DIAGNOSIS — G44229 Chronic tension-type headache, not intractable: Secondary | ICD-10-CM | POA: Insufficient documentation

## 2018-04-01 DIAGNOSIS — Z1211 Encounter for screening for malignant neoplasm of colon: Secondary | ICD-10-CM

## 2018-04-01 DIAGNOSIS — I10 Essential (primary) hypertension: Secondary | ICD-10-CM | POA: Insufficient documentation

## 2018-04-01 DIAGNOSIS — Z23 Encounter for immunization: Secondary | ICD-10-CM | POA: Insufficient documentation

## 2018-04-01 DIAGNOSIS — G4700 Insomnia, unspecified: Secondary | ICD-10-CM | POA: Diagnosis not present

## 2018-04-01 MED ORDER — SUMATRIPTAN SUCCINATE 50 MG PO TABS: 50.0000 mg | ORAL_TABLET | ORAL | 1 refills | Status: DC | PRN

## 2018-04-01 MED ORDER — TRAZODONE HCL 100 MG PO TABS: 100.0000 mg | ORAL_TABLET | Freq: Every evening | ORAL | 1 refills | Status: DC | PRN

## 2018-04-01 MED FILL — traZODone HCL 100 MG TABS: 100 | 90 days supply | Qty: 90 | Fill #0

## 2018-04-01 MED FILL — SUMATRIPTAN SUCC 50 MG TAB: 50 | 30 days supply | Qty: 9 | Fill #0

## 2018-04-01 NOTE — Progress Notes (Signed)
Assessment & Plan:  Kathleen Davis was seen today for hospitalization follow-up.  Diagnoses and all orders for this visit:  Chronic tension-type headache, not intractable -     SUMAtriptan (IMITREX) 50 MG tablet; Take 1 tablet (50 mg total) by mouth every 2 (two) hours as needed for migraine. May repeat in 2 hours if headache persists or recurs.  Breast cancer screening by mammogram -     MM 3D SCREEN BREAST BILATERAL; Future  Colon cancer screening -     Ambulatory referral to Gastroenterology  Encounter for vaccination -     Tdap vaccine greater than or equal to 7yo IM -     Flu Vaccine QUAD 36+ mos IM  Primary insomnia -     traZODone (DESYREL) 100 MG tablet; Take 1 tablet (100 mg total) by mouth at bedtime as needed for sleep.    Patient has been counseled on age-appropriate routine health concerns for screening and prevention. These are reviewed and up-to-date. Referrals have been placed accordingly. Immunizations are up-to-date or declined.    Subjective:   Chief Complaint  Patient presents with  . Hospitalization Follow-up    Pt. is here for abdominal pain. Pt. stated it got better.    HPI Kathleen Davis 53 y.o. female presents to office today for hospital follow up. She has a PMHx of HTN, seizures, headaches, and depression  LLQ abdominal Pain Currently resolved. Seen in the ED on 02-28-2018 with complaints of abdominal pain for the past 2 months. She was treated with IVFs, Morphine, zofran. She was treated for possible UTI with keflex. CMP and lipase were WNL.   Chronic Headaches Occurring every day. Location: left temporal. Described as sharp. Taking ibuprofen at least 3 times per day. Neurology recommended avoidance of tylenol, excedrin per patient. Will try imitrex. I have also recommended she follow back up with the headache clinic or neurology.   Insomnia Difficulty falling asleep and staying asleep. She has tried trazodone 50mg  in the past with some mild relief of  insomnia. Will try 100 mg today.     Review of Systems  Constitutional: Negative for fever, malaise/fatigue and weight loss.  HENT: Negative.  Negative for nosebleeds.   Eyes: Negative.  Negative for blurred vision, double vision and photophobia.  Respiratory: Negative.  Negative for cough and shortness of breath.   Cardiovascular: Negative.  Negative for chest pain, palpitations and leg swelling.  Gastrointestinal: Negative.  Negative for heartburn, nausea and vomiting.  Musculoskeletal: Negative.  Negative for myalgias.  Neurological: Positive for headaches. Negative for dizziness, focal weakness and seizures.  Psychiatric/Behavioral: Negative for suicidal ideas. The patient has insomnia.     Past Medical History:  Diagnosis Date  . Chronic headaches   . Depression   . Hypertension   . Seizures (HCC)     Past Surgical History:  Procedure Laterality Date  . TUBAL LIGATION      Family History  Problem Relation Age of Onset  . Hypertension Mother   . Diabetes Mother   . Stroke Mother   . Hypertension Father   . Cancer Father   . Seizures Father   . Hypertension Sister   . Diabetes Sister   . Breast cancer Sister   . Hypertension Brother   . Diabetes Brother     Social History Reviewed with no changes to be made today.   Outpatient Medications Prior to Visit  Medication Sig Dispense Refill  . ibuprofen (ADVIL,MOTRIN) 200 MG tablet Take 400 mg by mouth  every 6 (six) hours as needed for headache (pain).    Marland Kitchen lamoTRIgine (LAMICTAL) 100 MG tablet Take 1 tablet (100 mg total) by mouth 2 (two) times daily. For mood stabilization (Patient not taking: Reported on 02/28/2018) 60 tablet 0  . levETIRAcetam (KEPPRA) 1000 MG tablet Take 1 tablet (1,000 mg total) by mouth 3 (three) times daily. For seizures (Patient not taking: Reported on 02/28/2018) 90 tablet 0  . ondansetron (ZOFRAN ODT) 4 MG disintegrating tablet Take 1 tablet (4 mg total) by mouth every 8 (eight) hours as needed  for nausea or vomiting. (Patient not taking: Reported on 04/01/2018) 15 tablet 0  . OVER THE COUNTER MEDICATION Place 2 drops into both eyes 2 (two) times daily.    . cephALEXin (KEFLEX) 500 MG capsule 2 caps po bid x 7 days (Patient not taking: Reported on 04/01/2018) 28 capsule 0  . furosemide (LASIX) 20 MG tablet Take 1 tablet (20 mg total) by mouth daily. (Patient not taking: Reported on 12/16/2017) 5 tablet 0  . hydrOXYzine (ATARAX/VISTARIL) 25 MG tablet Take 1 tablet (25 mg total) by mouth every 6 (six) hours as needed for itching or anxiety. (Patient not taking: Reported on 12/16/2017) 60 tablet 0  . risperiDONE (RISPERDAL) 2 MG tablet Take 1 tablet (2 mg total) by mouth at bedtime. For mood control (Patient not taking: Reported on 12/16/2017) 30 tablet 0  . traZODone (DESYREL) 50 MG tablet Take 1 tablet (50 mg total) by mouth at bedtime as needed for sleep. (Patient not taking: Reported on 12/16/2017) 30 tablet 0   No facility-administered medications prior to visit.     No Known Allergies     Objective:    BP (!) 157/93 (BP Location: Right Arm, Patient Position: Sitting, Cuff Size: Large)   Pulse 72   Temp 98.5 F (36.9 C) (Oral)   Ht 5\' 4"  (1.626 m)   Wt 217 lb 3.2 oz (98.5 kg)   LMP 06/13/2011   SpO2 98%   BMI 37.28 kg/m  Wt Readings from Last 3 Encounters:  04/01/18 217 lb 3.2 oz (98.5 kg)  02/28/18 168 lb (76.2 kg)  12/16/17 230 lb (104.3 kg)    Physical Exam Vitals signs and nursing note reviewed.  Constitutional:      Appearance: She is well-developed.  HENT:     Head: Normocephalic and atraumatic.  Neck:     Musculoskeletal: Normal range of motion.  Cardiovascular:     Rate and Rhythm: Normal rate and regular rhythm.     Heart sounds: Normal heart sounds. No murmur. No friction rub. No gallop.   Pulmonary:     Effort: Pulmonary effort is normal. No tachypnea or respiratory distress.     Breath sounds: Normal breath sounds. No decreased breath sounds,  wheezing, rhonchi or rales.  Chest:     Chest wall: No tenderness.  Abdominal:     General: Bowel sounds are normal.     Palpations: Abdomen is soft.  Musculoskeletal: Normal range of motion.  Skin:    General: Skin is warm and dry.  Neurological:     Mental Status: She is alert and oriented to person, place, and time.     Cranial Nerves: Cranial nerves are intact.     Sensory: Sensation is intact.     Motor: Motor function is intact.     Coordination: Coordination is intact. Coordination normal.     Gait: Gait is intact.  Psychiatric:        Behavior: Behavior  normal. Behavior is cooperative.        Thought Content: Thought content normal.        Judgment: Judgment normal.        Patient has been counseled extensively about nutrition and exercise as well as the importance of adherence with medications and regular follow-up. The patient was given clear instructions to go to ER or return to medical center if symptoms don't improve, worsen or new problems develop. The patient verbalized understanding.   Follow-up: Return in about 3 weeks (around 04/22/2018) for PAP SMEAR; BP; and headaches .   Claiborne RiggZelda W Fleming, FNP-BC [facility] [place], [place] [phone]   04/04/2018, 2:12 PM

## 2018-04-02 ENCOUNTER — Encounter: Payer: Self-pay | Admitting: Gastroenterology

## 2018-04-04 ENCOUNTER — Encounter: Payer: Self-pay | Admitting: Nurse Practitioner

## 2018-04-04 DIAGNOSIS — Z789 Other specified health status: Secondary | ICD-10-CM | POA: Insufficient documentation

## 2018-04-04 HISTORY — DX: Other specified health status: Z78.9

## 2018-04-04 MED ORDER — AMLODIPINE BESYLATE 5 MG PO TABS: 5.0000 mg | ORAL_TABLET | Freq: Every day | ORAL | 3 refills | Status: DC

## 2018-04-06 ENCOUNTER — Telehealth: Payer: Self-pay

## 2018-04-06 MED FILL — AMLODIPINE BESYLATE 5 MG TA: 5 | 30 days supply | Qty: 30 | Fill #0

## 2018-04-06 NOTE — Telephone Encounter (Signed)
CMA spoke to patient to inform her BP was sent to the pharmacy.  Pt. Verified DOB. Pt.understood.

## 2018-04-06 NOTE — Telephone Encounter (Signed)
-----   Message from Claiborne Rigg, NP sent at 04/04/2018  2:14 PM EST ----- Please let patient know I have sent in a blood pressure medication for her to take

## 2018-04-16 ENCOUNTER — Ambulatory Visit (HOSPITAL_COMMUNITY): Payer: Self-pay

## 2018-04-17 ENCOUNTER — Ambulatory Visit: Payer: Self-pay | Admitting: Gastroenterology

## 2018-05-01 ENCOUNTER — Ambulatory Visit: Payer: Self-pay | Admitting: Gastroenterology

## 2018-05-01 ENCOUNTER — Encounter: Payer: Self-pay | Admitting: Gastroenterology

## 2018-05-04 DIAGNOSIS — G40119 Localization-related (focal) (partial) symptomatic epilepsy and epileptic syndromes with simple partial seizures, intractable, without status epilepticus: Secondary | ICD-10-CM | POA: Diagnosis not present

## 2018-05-04 DIAGNOSIS — Z79899 Other long term (current) drug therapy: Secondary | ICD-10-CM | POA: Diagnosis not present

## 2018-05-04 DIAGNOSIS — Z9851 Tubal ligation status: Secondary | ICD-10-CM | POA: Diagnosis not present

## 2018-05-04 DIAGNOSIS — Z820 Family history of epilepsy and other diseases of the nervous system: Secondary | ICD-10-CM | POA: Diagnosis not present

## 2018-05-04 DIAGNOSIS — I10 Essential (primary) hypertension: Secondary | ICD-10-CM | POA: Diagnosis not present

## 2018-05-04 DIAGNOSIS — G43909 Migraine, unspecified, not intractable, without status migrainosus: Secondary | ICD-10-CM | POA: Diagnosis not present

## 2018-05-04 DIAGNOSIS — G40909 Epilepsy, unspecified, not intractable, without status epilepticus: Secondary | ICD-10-CM | POA: Diagnosis not present

## 2018-05-06 ENCOUNTER — Ambulatory Visit: Payer: Self-pay

## 2018-05-12 ENCOUNTER — Ambulatory Visit: Payer: Self-pay | Admitting: Nurse Practitioner

## 2018-05-13 DIAGNOSIS — G378 Other specified demyelinating diseases of central nervous system: Secondary | ICD-10-CM | POA: Diagnosis not present

## 2018-05-13 DIAGNOSIS — R9089 Other abnormal findings on diagnostic imaging of central nervous system: Secondary | ICD-10-CM | POA: Diagnosis not present

## 2018-05-13 DIAGNOSIS — G40919 Epilepsy, unspecified, intractable, without status epilepticus: Secondary | ICD-10-CM | POA: Diagnosis not present

## 2018-05-15 ENCOUNTER — Ambulatory Visit: Payer: Self-pay | Admitting: Gastroenterology

## 2018-07-09 ENCOUNTER — Ambulatory Visit: Payer: Medicare Other

## 2018-07-14 ENCOUNTER — Ambulatory Visit: Payer: Medicare Other | Attending: Nurse Practitioner | Admitting: Nurse Practitioner

## 2018-07-14 ENCOUNTER — Other Ambulatory Visit: Payer: Self-pay

## 2018-07-14 ENCOUNTER — Encounter: Payer: Self-pay | Admitting: Nurse Practitioner

## 2018-07-14 DIAGNOSIS — R51 Headache: Secondary | ICD-10-CM | POA: Insufficient documentation

## 2018-07-14 DIAGNOSIS — G40909 Epilepsy, unspecified, not intractable, without status epilepticus: Secondary | ICD-10-CM | POA: Insufficient documentation

## 2018-07-14 DIAGNOSIS — F17290 Nicotine dependence, other tobacco product, uncomplicated: Secondary | ICD-10-CM | POA: Diagnosis not present

## 2018-07-14 DIAGNOSIS — Z79899 Other long term (current) drug therapy: Secondary | ICD-10-CM | POA: Diagnosis not present

## 2018-07-14 DIAGNOSIS — I10 Essential (primary) hypertension: Secondary | ICD-10-CM | POA: Diagnosis not present

## 2018-07-14 DIAGNOSIS — Z8249 Family history of ischemic heart disease and other diseases of the circulatory system: Secondary | ICD-10-CM | POA: Insufficient documentation

## 2018-07-14 MED ORDER — MAGNESIUM OXIDE 400 MG PO TABS: ORAL_TABLET | ORAL | 0 refills | Status: AC

## 2018-07-14 MED ORDER — AMLODIPINE BESYLATE 5 MG PO TABS: 5.0000 mg | ORAL_TABLET | Freq: Every day | ORAL | 3 refills | Status: DC
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
## 2018-07-17 ENCOUNTER — Encounter: Payer: Self-pay | Admitting: Gastroenterology

## 2018-07-24 DIAGNOSIS — R9401 Abnormal electroencephalogram [EEG]: Secondary | ICD-10-CM | POA: Diagnosis not present

## 2018-07-24 DIAGNOSIS — I491 Atrial premature depolarization: Secondary | ICD-10-CM | POA: Diagnosis not present

## 2018-07-24 DIAGNOSIS — I493 Ventricular premature depolarization: Secondary | ICD-10-CM | POA: Diagnosis not present

## 2018-07-24 DIAGNOSIS — G43909 Migraine, unspecified, not intractable, without status migrainosus: Secondary | ICD-10-CM | POA: Diagnosis present

## 2018-07-24 DIAGNOSIS — G40909 Epilepsy, unspecified, not intractable, without status epilepticus: Secondary | ICD-10-CM | POA: Diagnosis not present

## 2018-07-24 DIAGNOSIS — G9381 Temporal sclerosis: Secondary | ICD-10-CM | POA: Diagnosis present

## 2018-07-24 DIAGNOSIS — G40219 Localization-related (focal) (partial) symptomatic epilepsy and epileptic syndromes with complex partial seizures, intractable, without status epilepticus: Secondary | ICD-10-CM | POA: Diagnosis present

## 2018-07-24 DIAGNOSIS — G40802 Other epilepsy, not intractable, without status epilepticus: Secondary | ICD-10-CM | POA: Diagnosis not present

## 2018-07-24 DIAGNOSIS — R569 Unspecified convulsions: Secondary | ICD-10-CM

## 2018-07-24 DIAGNOSIS — Z833 Family history of diabetes mellitus: Secondary | ICD-10-CM | POA: Diagnosis not present

## 2018-07-24 DIAGNOSIS — Z8249 Family history of ischemic heart disease and other diseases of the circulatory system: Secondary | ICD-10-CM | POA: Diagnosis not present

## 2018-07-24 DIAGNOSIS — F17210 Nicotine dependence, cigarettes, uncomplicated: Secondary | ICD-10-CM | POA: Diagnosis present

## 2018-07-24 DIAGNOSIS — Z820 Family history of epilepsy and other diseases of the nervous system: Secondary | ICD-10-CM | POA: Diagnosis not present

## 2018-07-24 DIAGNOSIS — Z79899 Other long term (current) drug therapy: Secondary | ICD-10-CM | POA: Diagnosis not present

## 2018-07-24 DIAGNOSIS — I10 Essential (primary) hypertension: Secondary | ICD-10-CM

## 2018-07-24 HISTORY — DX: Unspecified convulsions: R56.9

## 2018-07-24 HISTORY — DX: Essential (primary) hypertension: I10

## 2018-08-12 ENCOUNTER — Telehealth: Payer: Self-pay | Admitting: Emergency Medicine

## 2018-08-12 NOTE — Telephone Encounter (Signed)
Covid-19 screening questions   Do you now or have you had a fever in the last 14 days no   Do you have any respiratory symptoms of shortness of breath or cough now or in the last 14 days no  Do you have any family members or close contacts with diagnosed or suspected Covid-19 in the past 14 days no  Have you been tested for Covid-19 and found to be positive no          

## 2018-08-12 NOTE — Progress Notes (Signed)
Referring Provider: Gildardo Pounds, NP Primary Care Physician:  Gildardo Pounds, NP  Reason for Consultation:  Recent abdominal pain   IMPRESSION:  Recent LLQ abdominal pain, now resolved    - CT abd/pelvis 02/28/18 nondiagnostic    -Normal liver enzymes, lipase, and WBC 02/28/2018 Epilepsy with last seizure 3 months ago No prior colon cancer screening No known family history of colon cancer or polyps  Etiology of her abdominal pain is unclear.  However it has resolved and not recurred in over 4 months.  I have asked her to contact me with any recurrent symptoms in the future.  She is due colon cancer screening and I have recommended a colonoscopy today.   PLAN: Screening colonoscopy   I consented the patient at the bedside today discussing the risks, benefits, and alternatives to endoscopic evaluation. In particular, we discussed the risks that include, but are not limited to, reaction to medication, cardiopulmonary compromise, bleeding requiring blood transfusion, aspiration resulting in pneumonia, perforation requiring surgery, lack of diagnosis, severe illness requiring hospitalization, and even death. We reviewed the risk of missed lesion including polyps or even cancer. The patient acknowledges these risks and asks that we proceed.   HPI: Kathleen Davis is a 53 y.o. female She has headaches, hypertension, and epilepsy. Last seizure 3 months. Followed by Dr. Fritz Pickerel at [facility].    She had 2 months of sharp, stabbing, nonradiating LLQ pain. Stuttering prior to the ED visit.  Worsened by movement.  No change with eating or defecation.  Had to support the abdominal abdominal.  Associated swelling in that area of her abdomen.  There is no associated change in bowel habits, diarrhea, constipation, blood in the stool, or melena.   She feels that she was seen in the ED for the pain 03/23/18. However, it looks her ER visit was in January.  At that time a CT and labs were negative except for the  possibility of a UTI.  She was treated with antibiotics, Tylenol, and Motrin.  No further abdominal pain since the ED. No change in bowel habits. Good appetite. Losing weight - 20 pounds by watching what she eats.   No prior colonoscopy or colon cancer screening.   I have reviewed the following information: I personally reviewed the images of her CT abd/pelvis with contrast 02/28/18: cholelithiasis.  Labs 02/28/18: Normal CMP including normal liver enzymes with AST 19, ALT 14, alk phos 85, total bilirubin 0.5.  Lipase 33.  White count 6.6, hemoglobin 11.9, MCV 97, RDW 14, platelets 358  No family history of colon cancer or polyps. No family history of gastric cancer, pancreatitis cancer, uterine or endometrial cancer.   Past Medical History:  Diagnosis Date  . Chronic headaches   . Depression   . Hypertension   . Seizures ([place])     Past Surgical History:  Procedure Laterality Date  . TUBAL LIGATION      Prior to Admission medications   Medication Sig Start Date End Date Taking? Authorizing Provider  amLODipine (NORVASC) 5 MG tablet Take 1 tablet (5 mg total) by mouth daily. 07/14/18   Gildardo Pounds, NP  ibuprofen (ADVIL,MOTRIN) 200 MG tablet Take 400 mg by mouth every 6 (six) hours as needed for headache (pain).    [provider]  lamoTRIgine (LAMICTAL) 100 MG tablet Take 1 tablet (100 mg total) by mouth 2 (two) times daily. For mood stabilization Patient not taking: Reported on 02/28/2018 08/01/17   Encarnacion Slates, NP  levETIRAcetam (KEPPRA) 1000 MG tablet Take 1 tablet (1,000 mg total) by mouth 3 (three) times daily. For seizures 08/01/17   Lindell Spar I, NP  magnesium oxide (MAG-OX) 400 MG tablet Take 1-2 tablets (400-860m) by mouth twice per day for headache prevention. 07/14/18   FGildardo Pounds NP  ondansetron (ZOFRAN ODT) 4 MG disintegrating tablet Take 1 tablet (4 mg total) by mouth every 8 (eight) hours as needed for nausea or vomiting. Patient not taking:  Reported on 04/01/2018 02/28/18   Street, [place] PA-C  OVER THE COUNTER MEDICATION Place 2 drops into both eyes 2 (two) times daily.    [provider]  SUMAtriptan (IMITREX) 50 MG tablet Take 1 tablet (50 mg total) by mouth every 2 (two) hours as needed for migraine. May repeat in 2 hours if headache persists or recurs. 04/01/18   FGildardo Pounds NP  traZODone (DESYREL) 100 MG tablet Take 1 tablet (100 mg total) by mouth at bedtime as needed for sleep. 04/01/18 06/30/18  FGildardo Pounds NP    Current Outpatient Medications  Medication Sig Dispense Refill  . amLODipine (NORVASC) 5 MG tablet Take 1 tablet (5 mg total) by mouth daily. 90 tablet 3  . ibuprofen (ADVIL,MOTRIN) 200 MG tablet Take 400 mg by mouth every 6 (six) hours as needed for headache (pain).    .Marland KitchenlamoTRIgine (LAMICTAL) 100 MG tablet Take 1 tablet (100 mg total) by mouth 2 (two) times daily. For mood stabilization (Patient not taking: Reported on 02/28/2018) 60 tablet 0  . levETIRAcetam (KEPPRA) 1000 MG tablet Take 1 tablet (1,000 mg total) by mouth 3 (three) times daily. For seizures 90 tablet 0  . magnesium oxide (MAG-OX) 400 MG tablet Take 1-2 tablets (400-8058m by mouth twice per day for headache prevention. 60 tablet 0  . ondansetron (ZOFRAN ODT) 4 MG disintegrating tablet Take 1 tablet (4 mg total) by mouth every 8 (eight) hours as needed for nausea or vomiting. (Patient not taking: Reported on 04/01/2018) 15 tablet 0  . OVER THE COUNTER MEDICATION Place 2 drops into both eyes 2 (two) times daily.    . SUMAtriptan (IMITREX) 50 MG tablet Take 1 tablet (50 mg total) by mouth every 2 (two) hours as needed for migraine. May repeat in 2 hours if headache persists or recurs. 20 tablet 1  . traZODone (DESYREL) 100 MG tablet Take 1 tablet (100 mg total) by mouth at bedtime as needed for sleep. 90 tablet 1   No current facility-administered medications for this visit.     Allergies as of 08/13/2018  . (No Known Allergies)     Family History  Problem Relation Age of Onset  . Hypertension Mother   . Diabetes Mother   . Stroke Mother   . Hypertension Father   . Cancer Father   . Seizures Father   . Hypertension Sister   . Diabetes Sister   . Breast cancer Sister   . Hypertension Brother   . Diabetes Brother     Social History   Socioeconomic History  . Marital status: Single    Spouse name: Not on file  . Number of children: Not on file  . Years of education: Not on file  . Highest education level: Not on file  Occupational History  . Not on file  Social Needs  . Financial resource strain: Not on file  . Food insecurity    Worry: Not on file    Inability: Not on file  . Transportation needs  Medical: Not on file    Non-medical: Not on file  Tobacco Use  . Smoking status: Current Every Day Smoker    Types: Cigarettes, Cigars  . Smokeless tobacco: Never Used  . Tobacco comment: quit about 3 months ago  Substance and Sexual Activity  . Alcohol use: Never    Frequency: Never  . Drug use: Never  . Sexual activity: Yes  Lifestyle  . Physical activity    Days per week: Not on file    Minutes per session: Not on file  . Stress: Not on file  Relationships  . Social Herbalist on phone: Not on file    Gets together: Not on file    Attends religious service: Not on file    Active member of club or organization: Not on file    Attends meetings of clubs or organizations: Not on file    Relationship status: Not on file  . Intimate partner violence    Fear of current or ex partner: Not on file    Emotionally abused: Not on file    Physically abused: Not on file    Forced sexual activity: Not on file  Other Topics Concern  . Not on file  Social History Narrative  . Not on file    Review of Systems: 12 system ROS is negative except as noted above.   Physical Exam: General:   Alert,  well-nourished, pleasant and cooperative in NAD Head:  Normocephalic and atraumatic.  Eyes:  Sclera clear, no icterus.   Conjunctiva pink. Ears:  Normal auditory acuity. Nose:  No deformity, discharge,  or lesions. Mouth:  No deformity or lesions.   Neck:  Supple; no masses or thyromegaly. Lungs:  Clear throughout to auscultation.   No wheezes. Heart:  Regular rate and rhythm; no murmurs. Abdomen:  Soft,nontender, nondistended, normal bowel sounds, no rebound or guarding.  I am unable to reproduce her pain.  No hepatosplenomegaly.   Rectal:  Deferred  Msk:  Symmetrical. No boney deformities LAD: No inguinal or umbilical LAD Extremities:  No clubbing or edema. Neurologic:  Alert and  oriented x4;  grossly nonfocal Skin:  Intact without significant lesions or rashes. Psych:  Alert and cooperative. Normal mood and affect.   Naren Benally L. Tarri Glenn, MD, MPH 08/12/2018, 6:02 PM

## 2018-08-13 ENCOUNTER — Ambulatory Visit (INDEPENDENT_AMBULATORY_CARE_PROVIDER_SITE_OTHER): Payer: Medicare Other | Admitting: Gastroenterology

## 2018-08-13 ENCOUNTER — Encounter: Payer: Self-pay | Admitting: Gastroenterology

## 2018-08-13 VITALS — BP 128/80 | HR 82 | Ht 64.0 in | Wt 222.0 lb

## 2018-08-13 DIAGNOSIS — Z1211 Encounter for screening for malignant neoplasm of colon: Secondary | ICD-10-CM | POA: Diagnosis not present

## 2018-08-13 DIAGNOSIS — R1032 Left lower quadrant pain: Secondary | ICD-10-CM

## 2018-08-13 MED ORDER — NA SULFATE-K SULFATE-MG SULF 17.5-3.13-1.6 GM/177ML PO SOLN: 1.0000 | ORAL | 0 refills | Status: AC

## 2018-08-13 NOTE — Patient Instructions (Signed)
I have recommended a colonoscopy.   Tips for colonoscopy:  -STAY WELL HYDRATED FOR 3-4 DAYS PRIOR TO THE EXAM. This reduces nausea and dehydration.  -TO PREVENT SKIN/HEMORRHOID IRRITATION- prior to wiping, put A&Dointment or vaseline on the toilet paper. -Keep a towel or pad on the bed.  -DRINK 64oz of clear liquids in the morning of prep day (PRIOR TO STARTING THE PREP) to be sure that there is enough fluid to flush the colon and stay hydrated!!!! This is in addition to the fluids required for preparation.

## 2018-08-19 ENCOUNTER — Ambulatory Visit
Admission: RE | Admit: 2018-08-19 | Discharge: 2018-08-19 | Disposition: A | Discharge status: Home / Self Care | Payer: Medicare Other | Source: Ambulatory Visit | Attending: Nurse Practitioner | Admitting: Nurse Practitioner

## 2018-08-19 ENCOUNTER — Other Ambulatory Visit: Payer: Self-pay

## 2018-08-19 DIAGNOSIS — Z1231 Encounter for screening mammogram for malignant neoplasm of breast: Secondary | ICD-10-CM | POA: Diagnosis not present

## 2018-08-24 ENCOUNTER — Other Ambulatory Visit: Payer: Medicare Other | Admitting: Nurse Practitioner

## 2018-08-25 NOTE — Progress Notes (Signed)
CMA spoke to patient to inform on mammogram results.  Pt. Verified DOB. Pt. Understood.

## 2018-09-04 DIAGNOSIS — G40109 Localization-related (focal) (partial) symptomatic epilepsy and epileptic syndromes with simple partial seizures, not intractable, without status epilepticus: Secondary | ICD-10-CM | POA: Diagnosis not present

## 2018-09-04 DIAGNOSIS — I10 Essential (primary) hypertension: Secondary | ICD-10-CM | POA: Diagnosis not present

## 2018-09-04 DIAGNOSIS — Z79899 Other long term (current) drug therapy: Secondary | ICD-10-CM | POA: Diagnosis not present

## 2018-09-07 ENCOUNTER — Other Ambulatory Visit: Payer: Self-pay

## 2018-09-07 ENCOUNTER — Encounter: Payer: Self-pay | Admitting: Nurse Practitioner

## 2018-09-07 ENCOUNTER — Other Ambulatory Visit (HOSPITAL_COMMUNITY)
Admission: RE | Admit: 2018-09-07 | Discharge: 2018-09-07 | Disposition: A | Discharge status: Home / Self Care | Payer: Medicare Other | Source: Ambulatory Visit | Attending: Nurse Practitioner | Admitting: Nurse Practitioner

## 2018-09-07 ENCOUNTER — Ambulatory Visit (HOSPITAL_BASED_OUTPATIENT_CLINIC_OR_DEPARTMENT_OTHER): Payer: Medicare Other | Admitting: Nurse Practitioner

## 2018-09-07 VITALS — BP 125/81 | HR 65 | Temp 98.9°F | Ht 64.0 in | Wt 224.0 lb

## 2018-09-07 DIAGNOSIS — Z1151 Encounter for screening for human papillomavirus (HPV): Secondary | ICD-10-CM | POA: Diagnosis not present

## 2018-09-07 DIAGNOSIS — Z124 Encounter for screening for malignant neoplasm of cervix: Secondary | ICD-10-CM | POA: Diagnosis not present

## 2018-09-07 DIAGNOSIS — I10 Essential (primary) hypertension: Secondary | ICD-10-CM | POA: Diagnosis not present

## 2018-09-07 DIAGNOSIS — R87810 Cervical high risk human papillomavirus (HPV) DNA test positive: Secondary | ICD-10-CM | POA: Insufficient documentation

## 2018-09-07 DIAGNOSIS — N760 Acute vaginitis: Secondary | ICD-10-CM | POA: Diagnosis not present

## 2018-09-07 DIAGNOSIS — R87613 High grade squamous intraepithelial lesion on cytologic smear of cervix (HGSIL): Secondary | ICD-10-CM | POA: Diagnosis not present

## 2018-09-07 DIAGNOSIS — B9689 Other specified bacterial agents as the cause of diseases classified elsewhere: Secondary | ICD-10-CM | POA: Diagnosis not present

## 2018-09-07 DIAGNOSIS — F5101 Primary insomnia: Secondary | ICD-10-CM | POA: Diagnosis not present

## 2018-09-07 MED ORDER — TRAZODONE HCL 100 MG PO TABS: 100.0000 mg | ORAL_TABLET | Freq: Every evening | ORAL | 1 refills | Status: DC | PRN

## 2018-09-07 MED ORDER — AMLODIPINE BESYLATE 5 MG PO TABS: 5.0000 mg | ORAL_TABLET | Freq: Every day | ORAL | 3 refills | Status: DC

## 2018-09-07 NOTE — Patient Instructions (Signed)
Human Papillomavirus Human papillomavirus (HPV) is the most common sexually transmitted infection (STI). It spreads easily from person to person (ishighly contagious). There are many types of HPV. It usually does not cause symptoms. However, sometimes it may cause wart-like lesions in the throat or warts in the genital area. It is possible to be infected for a long time and pass HPV to others without knowing it. Certain types of HPV may cause cancers, including cancer of the lower part of the uterus (cervix), vagina, outer female genital area (vulva), penis, anus, and rectum. HPV may also cause cancers of the oral cavity, such as the throat, tongue, and tonsils. What are the causes? HPV is caused by a virus that spreads from person to person through oral, vaginal, or anal sex. What increases the risk? You may be more likely to develop this condition if you have or have had:  Unprotected oral, vaginal, or anal sex.  Several sex partners.  A sex partner who has other sex partners.  Another STI.  A weak disease-fighting system (immune system).  Damaged skin in the genital, oral, or anal area. What are the signs or symptoms? Most people who have HPV do not have any symptoms. If symptoms are present, they may include:  Wart-like lesions in the throat (from having oral sex).  Warts on the infected skin or mucous membranes.  Genital warts that may itch, burn, bleed, or be painful during sex. How is this diagnosed? If you have wart-like lumps in the anal area or throat, or if genital warts are present, your health care provider can usually diagnose HPV with a physical exam. Genital warts are easily seen. In females, tests may be used to diagnose HPV, including:  A Pap test. A Pap test takes a sample of cells from the cervix to check for cancer and HPV infection.  An HPV test. This is similar to a Pap test and involves taking a sample of cells from the cervix.  Using a scope to view the  cervix (colposcopy). This may be done if a pelvic exam or Pap test is abnormal. A sample of tissue may be removed for testing (biopsy) during the colposcopy. Currently, there is no test to detect HPV in males. How is this treated? There is no treatment for the virus itself. However, there are treatments for the health problems and symptoms HPV can cause. Treatment for HPV may include:  Medicines in a cream, lotion, liquid, or gel form. These medicines may be injected into or applied to genital or anal warts.  Use of a probe to apply extreme cold (cryotherapy) to the genital or anal warts.  Application of an intense beam of light (laser treatment) on the genital or anal warts.  Use of a probe to apply extreme heat (electrocautery) on the genital or anal warts.  Surgery to remove the genital or anal warts. Your health care provider will monitor you closely after you are treated. HPV can come back and you may need treatment again. Follow these instructions at home: Medicines  Take over-the-counter and prescription medicines only as told by your health care provider. This include creams for itching or irritation.  Do not treat genital or anal warts with medicines used for treating hand warts. General instructions  Do not touch or scratch the warts.  Do not have sex while you are being treated.  Do not douche or use tampons during treatment (for women).  Tell your sex partner about your infection. He or she  may also need to be treated.  If you become pregnant, tell your health care provider that you have HPV. Your health care provider will monitor you closely during pregnancy to make sure you and your baby are safe.  Keep all follow-up visits as told by your health care provider. This is important. How is this prevented?  Talk with your health care provider about getting an HPV vaccine, which can prevent some HPV infections and related cancers. It will not work if you already have HPV  and is not recommended for pregnant women. You may need 2-3 doses of the vaccine, depending on your age.  After treatment, use condoms during sex to prevent future infections.  Have only one sex partner.  Have a sex partner who does not have other sex partners.  Get regular Pap tests as directed by your health care provider. Contact a health care provider if:  The treated skin becomes red, swollen, or painful.  You have a fever.  You feel generally ill.  You feel lumps or pimples in and around your genital or anal area.  You develop bleeding of the vagina or the treatment area.  You have painful sex. Summary  Human papillomavirus (HPV) is the most common sexually transmitted infection (STI) and is highly contagious.  Most people carrying HPV do not have any symptoms.  Many forms of HPV can be prevented with vaccination.  There is no treatment for the virus itself. However, there are treatments for the health problems and symptoms HPV can cause. This information is not intended to replace advice given to you by your health care provider. Make sure you discuss any questions you have with your health care provider. Document Released: 04/27/2003 Document Revised: 10/02/2017 Document Reviewed: 10/02/2017 Elsevier Patient Education  [employer].  HPV Test Why am I having this test? HPV (human papillomavirus) refers to a group of about 100 viruses. Many of these viruses cause growths on, in, or around the genitals. Most HPV viruses cause infections that usually go away without treatment.  The HPV test checks for high-risk types (strains) of HPV. Strains 16 and 18 are considered the most high-risk for cancer. If you have strain 16 or 18 HPV and it is not treated, it can increase your risk for cancer of the cervix, vagina, vulva, or anus. HPV can be found in both males and females. However, the HPV test is used to screen for increased cancer risk in females:  Who are 52-30  years old.  Who have an abnormal Pap test.  Who have been treated for an abnormal Pap test in the past.  Who have been treated for a high-risk HPV infection in the past. If you are a woman older than 30, you may have the HPV test at the same time as a pelvic exam and Pap test. What is being tested? This test checks for the DNA (genetic) strands of the HPV infection. This test is also called the HPV DNA test. What kind of sample is taken?  This test requires a sample of cells from the cervix. This will be done using a small cotton swab, plastic spatula, or brush. This sample is often collected during a pelvic exam, when you are lying on your back on an exam table with feet in footrests (stirrups). How do I prepare for this test?  Starting 24-48 hours before your test, or as told by your health care provider, do not: ? Take a bath. ? Have sex. ?  Douche.  Schedule the test for a day when you are not menstruating. If you are menstruating on the day of the test, you may need to reschedule.  You will be asked to urinate right before the test. How are the results reported? Your test results will be reported as either positive or negative for HPV. If you have a positive result, the results will indicate which HPV strain you are positive for. What do the results mean? A negative HPV test result means that no HPV was found. This means it is very likely that you do not have HPV. A positive HPV test result means that you have HPV.  If your results show the presence of any high-risk strains, you may have a higher risk of developing anal or cervical cancer if your infection is not treated.  If any low-risk HPV strains are found, it is not likely that you have an increased risk for cancer. Talk with your health care provider about what your results mean. Questions to ask your health care provider Ask your health care provider, or the department that is doing the test:  When will my results be  ready?  How will I get my results?  What are my treatment options?  What other tests do I need?  What are my next steps? Summary  The human papillomavirus (HPV) test is used to look for high-risk types of HPV infection. This test is done only for females.  HPV types 16 and 18 are considered high-risk types of HPV. If untreated, these types of infections increase your risk for cancer of the cervix or anus.  A negative HPV test result means that no HPV was found, and it is very likely that you do not have HPV.  A positive HPV test result means that you have an HPV infection. This information is not intended to replace advice given to you by your health care provider. Make sure you discuss any questions you have with your health care provider. Document Released: 03/01/2004 Document Revised: 01/17/2017 Document Reviewed: 12/17/2016 Elsevier Patient Education  2020 Elsevier Inc.  Preventing Cervical Cancer  Cervical cancer is cancer that grows on the cervix. The cervix is at the bottom of the uterus. It connects the uterus to the vagina. The uterus is where a baby develops during pregnancy. Cancer occurs when cells become abnormal and start to grow out of control. Cervical cancer grows slowly and may not cause any symptoms at first. Over time, the cancer can grow deep into the cervix tissue and spread to other areas. If it is found early, cervical cancer can be treated effectively. You can also take steps to prevent this type of cancer. Most cases of cervical cancer are caused by an STI (sexually transmitted infection) called human papillomavirus (HPV). One way to reduce your risk of cervical cancer is to avoid infection with the HPV virus. You can do this by practicing safe sex and by getting the HPV vaccine. Getting regular Pap tests is also important because this can help identify changes in cells that could lead to cancer. Your chances of getting this disease can also be reduced by making  certain lifestyle changes. How can I protect myself from cervical cancer? Preventing HPV infection  Ask your health care provider about getting the HPV vaccine. If you are 52 years old or younger, you may need to get this vaccine, which is given in three doses over 6 months. This vaccine protects against the types of HPV that  could cause cancer.  Limit the number of people you have sex with. Also avoid having sex with people who have had many sex partners.  Use a latex condom during sex. Getting Pap tests  Get Pap tests regularly, starting at age 52. Talk with your health care provider about how often you need these tests. ? Most women who are 2721?52 years of age should have a Pap test every 3 years. ? Most women who are 6830?52 years of age should have a Pap test in combination with an HPV test every 5 years. ? Women with a higher risk of cervical cancer, such as those with a weakened immune system or those who have been exposed to the drug diethylstilbestrol (DES), may need more frequent testing. Making other lifestyle changes  Do not use any products that contain nicotine or tobacco, such as cigarettes and e-cigarettes. If you need help quitting, ask your health care provider.  Eat at least 5 servings of fruits and vegetables every day.  Lose weight if you are overweight. Why are these changes important?  These changes and screening tests are designed to address the factors that are known to increase the risk of cervical cancer. Taking these steps is the best way to reduce your risk.  Having regular Pap tests will help identify changes in cells that could lead to cancer. Steps can then be taken to prevent cancer from developing.  These changes will also help find cervical cancer early. This type of cancer can be treated effectively if it is found early. It can be more dangerous and difficult to treat if cancer has grown deep into your cervix or has spread.  In addition to making you  less likely to get cervical cancer, these changes will also provide other health benefits, such as the following: ? Practicing safe sex is important for preventing STIs and unplanned pregnancies. ? Avoiding tobacco can reduce your risk for other cancers and health issues. ? Eating a healthy diet and maintaining a healthy weight are good for your overall health. What can happen if changes are not made? In the early stages, cervical cancer might not have any symptoms. It can take many years for the cancer to grow and get deep into the cervix tissue. This may be happening without you knowing about it. If you develop any symptoms, such as pelvic pain or unusual discharge or bleeding from your vagina, you should see your health care provider right away. If cervical cancer is not found early, you might need treatments such as radiation, chemotherapy, or surgery. In some cases, surgery may mean that you will not be able to get pregnant or carry a pregnancy to term. Where to find support Talk with your health care provider, school nurse, or local health department for guidance about screening and vaccination. Some children and teens may be able to get the HPV vaccine free of charge through the U.S. government's Vaccines for Children [facility](VFC) program. Other places that provide vaccinations include:  Public health clinics. Check with your local health department.  Federally [facility], where you would pay only what you can afford. To find one near you, check this website: http://weiss.info/www.fqhc.org/find-an-fqhc/  R[facility] Health Clinics. These are part of a program for Medicare and Medicaid patients who live in rural areas. The National Breast and Cervical Cancer Early Detection Program also provides breast and cervical cancer screenings and diagnostic services to low-income, uninsured, and underinsured women. Cervical cancer can be passed down through families. Talk  with your health care provider or genetic  counselor to learn more about genetic testing for cancer. Where to find more information Learn more about cervical cancer from:  [employer]: HardChecks.bewww.acog.org/Patients/FAQs/Cervical-Cancer  American Cancer Society: www.cancer.org/cancer/cervicalcancer/  U.S. Centers for Disease Control and Prevention: MedicationWarning.tnwww.cdc.gov/cancer/cervical/ Summary  Talk with your health care provider about getting the HPV vaccine.  Be sure to get regular Pap tests as recommended by your health care provider.  See your health care provider right away if you have any pelvic pain or unusual discharge or bleeding from your vagina. This information is not intended to replace advice given to you by your health care provider. Make sure you discuss any questions you have with your health care provider. Document Released: 02/19/2015 Document Revised: 03/08/2017 Document Reviewed: 10/03/2015 Elsevier Patient Education  2020 [employer]

## 2018-09-07 NOTE — Progress Notes (Signed)
Assessment & Plan:  Kathleen Davis was seen today for gynecologic exam.  Diagnoses and all orders for this visit:  Encounter for Papanicolaou smear for cervical cancer screening -     Cytology - PAP -     Cervicovaginal ancillary only  Primary insomnia -     traZODone (DESYREL) 100 MG tablet; Take 1 tablet (100 mg total) by mouth at bedtime as needed for sleep.  Essential hypertension -     amLODipine (NORVASC) 5 MG tablet; Take 1 tablet (5 mg total) by mouth daily. Continue all antihypertensives as prescribed.  Remember to bring in your blood pressure log with you for your follow up appointment.  DASH/Mediterranean Diets are healthier choices for HTN.  BP Readings from Last 3 Encounters:  09/07/18 125/81  08/13/18 128/80  04/01/18 (!) 157/93     Patient has been counseled on age-appropriate routine health concerns for screening and prevention. These are reviewed and up-to-date. Referrals have been placed accordingly. Immunizations are up-to-date or declined.    Subjective:   Chief Complaint  Patient presents with  . Gynecologic Exam    Pt. is here for pap smear.    HPI Kathleen Davis 53 y.o. female presents to office today for PAP and refill of her trazodone.   Review of Systems  Constitutional: Negative.  Negative for chills, fever, malaise/fatigue and weight loss.  Respiratory: Negative.  Negative for cough, shortness of breath and wheezing.   Cardiovascular: Negative.  Negative for chest pain, orthopnea and leg swelling.  Gastrointestinal: Negative for abdominal pain.  Genitourinary: Negative.  Negative for flank pain.  Skin: Negative.  Negative for rash.  Psychiatric/Behavioral: Negative for suicidal ideas. The patient has insomnia.     Past Medical History:  Diagnosis Date  . Chronic headaches   . Depression   . Hypertension   . Seizures ([place])     Past Surgical History:  Procedure Laterality Date  . TUBAL LIGATION      Family History  Problem Relation Age of  Onset  . Hypertension Mother   . Diabetes Mother   . Stroke Mother   . Hypertension Father   . Cancer Father   . Seizures Father   . Hypertension Sister   . Diabetes Sister   . Breast cancer Sister   . Hypertension Brother   . Diabetes Brother   . Breast cancer Paternal Aunt     Social History Reviewed with no changes to be made today.   Outpatient Medications Prior to Visit  Medication Sig Dispense Refill  . lamoTRIgine (LAMICTAL) 100 MG tablet Take 1 tablet (100 mg total) by mouth 2 (two) times daily. For mood stabilization 60 tablet 0  . levETIRAcetam (KEPPRA) 1000 MG tablet Take 1 tablet (1,000 mg total) by mouth 3 (three) times daily. For seizures 90 tablet 0  . ibuprofen (ADVIL,MOTRIN) 200 MG tablet Take 400 mg by mouth every 6 (six) hours as needed for headache (pain).    . magnesium oxide (MAG-OX) 400 MG tablet Take 1-2 tablets (400-88m) by mouth twice per day for headache prevention. (Patient not taking: Reported on 09/07/2018) 60 tablet 0  . Na Sulfate-K Sulfate-Mg Sulf 17.5-3.13-1.6 GM/177ML SOLN Take 1 kit by mouth as directed for 30 days. (Patient not taking: Reported on 09/07/2018) 354 mL 0  . ondansetron (ZOFRAN ODT) 4 MG disintegrating tablet Take 1 tablet (4 mg total) by mouth every 8 (eight) hours as needed for nausea or vomiting. (Patient not taking: Reported on 09/07/2018) 15 tablet 0  .  OVER THE COUNTER MEDICATION Place 2 drops into both eyes 2 (two) times daily.    . SUMAtriptan (IMITREX) 50 MG tablet Take 1 tablet (50 mg total) by mouth every 2 (two) hours as needed for migraine. May repeat in 2 hours if headache persists or recurs. (Patient not taking: Reported on 09/07/2018) 20 tablet 1  . amLODipine (NORVASC) 5 MG tablet Take 1 tablet (5 mg total) by mouth daily. (Patient not taking: Reported on 09/07/2018) 90 tablet 3  . traZODone (DESYREL) 100 MG tablet Take 1 tablet (100 mg total) by mouth at bedtime as needed for sleep. 90 tablet 1   No facility-administered  medications prior to visit.     No Known Allergies     Objective:    BP 125/81 (BP Location: Left Arm, Patient Position: Sitting, Cuff Size: Large)   Pulse 65   Temp 98.9 F (37.2 C) (Oral)   Ht _0  (1.626 m)   Wt 224 lb (101.6 kg)   LMP 06/13/2011   SpO2 99%   BMI 38.45 kg/m  Wt Readings from Last 3 Encounters:  09/07/18 224 lb (101.6 kg)  08/13/18 222 lb (100.7 kg)  04/01/18 217 lb 3.2 oz (98.5 kg)    Physical Exam Constitutional:      Appearance: She is well-developed.  HENT:     Head: Normocephalic.  Cardiovascular:     Rate and Rhythm: Normal rate and regular rhythm.     Heart sounds: Normal heart sounds.  Pulmonary:     Effort: Pulmonary effort is normal.     Breath sounds: Normal breath sounds.  Abdominal:     General: Bowel sounds are normal.     Palpations: Abdomen is soft.     Hernia: There is no hernia in the left inguinal area.  Genitourinary:    Labia:        Right: No rash, tenderness, lesion or injury.        Left: No rash, tenderness, lesion or injury.      Vagina: Normal. No signs of injury and foreign body. No vaginal discharge, erythema, tenderness or bleeding.     Cervix: Normal.     Uterus: Normal. Not deviated and not enlarged.      Adnexa: Right adnexa normal and left adnexa normal.       Right: No mass, tenderness or fullness.         Left: No mass, tenderness or fullness.       Rectum: Normal. No external hemorrhoid.  Lymphadenopathy:     Lower Body: No right inguinal adenopathy. No left inguinal adenopathy.  Skin:    General: Skin is warm and dry.  Neurological:     Mental Status: She is alert and oriented to person, place, and time.  Psychiatric:        Behavior: Behavior normal.        Thought Content: Thought content normal.        Judgment: Judgment normal.          Patient has been counseled extensively about nutrition and exercise as well as the importance of adherence with medications and regular follow-up. The  patient was given clear instructions to go to ER or return to medical center if symptoms don't improve, worsen or new problems develop. The patient verbalized understanding.   Follow-up: Return in about 3 months (around 12/08/2018).   Gildardo Pounds, FNP-BC [facility] [place], [place]   09/10/2018, 7:29 PM

## 2018-09-09 ENCOUNTER — Telehealth: Payer: Self-pay | Admitting: Nurse Practitioner

## 2018-09-09 NOTE — Telephone Encounter (Signed)
Pt is calling because she just had a pap smear done Monday, and is having heavy discharge..please follow up

## 2018-09-10 ENCOUNTER — Encounter: Payer: Self-pay | Admitting: Nurse Practitioner

## 2018-09-10 LAB — CYTOLOGY - PAP
Diagnosis: HIGH — AB
HPV: DETECTED — AB

## 2018-09-10 LAB — CERVICOVAGINAL ANCILLARY ONLY
Bacterial vaginitis: POSITIVE — AB
Candida vaginitis: NEGATIVE
Chlamydia: NEGATIVE
Neisseria Gonorrhea: NEGATIVE
Trichomonas: POSITIVE — AB

## 2018-09-10 MED ORDER — METRONIDAZOLE 500 MG PO TABS: 500.0000 mg | ORAL_TABLET | Freq: Two times a day (BID) | ORAL | 0 refills | Status: AC

## 2018-09-10 NOTE — Telephone Encounter (Signed)
Pt calling for PAP results.

## 2018-09-11 ENCOUNTER — Telehealth: Payer: Self-pay | Admitting: *Deleted

## 2018-09-11 NOTE — Telephone Encounter (Signed)
Mary from [facility] called stating that it is their policy that they call office to inform nurse with patient results. Staff asked Kathleen Davis if it was okay for her to leave results with Medical Assistant or did she only want to talk with a nurse. Per Kathleen Davis she can Engineer, production with results. Per Kathleen Davis, patient had a high grade Pap Smear and also there was some Trichomonas found. Informed Kathleen Davis that per patient's chart, provider had already resulted that Pap and staff had already informed patient with the results. Mary verbalized understanding.

## 2018-09-11 NOTE — Telephone Encounter (Signed)
Patient aware of results per Ubaldo Glassing, CMA

## 2018-09-16 ENCOUNTER — Telehealth: Payer: Self-pay | Admitting: Gastroenterology

## 2018-09-16 NOTE — Telephone Encounter (Signed)

## 2018-09-17 ENCOUNTER — Encounter: Payer: Self-pay | Admitting: Gastroenterology

## 2018-09-17 ENCOUNTER — Other Ambulatory Visit: Payer: Self-pay

## 2018-09-17 ENCOUNTER — Ambulatory Visit: Payer: Medicare Other | Admitting: Gastroenterology

## 2018-09-17 VITALS — BP 157/93 | HR 93 | Temp 98.2°F | Ht 64.0 in | Wt 222.0 lb

## 2018-09-17 DIAGNOSIS — R1032 Left lower quadrant pain: Secondary | ICD-10-CM

## 2018-09-17 MED ORDER — SODIUM CHLORIDE 0.9 % IV SOLN: 500.0000 mL | Freq: Once | INTRAVENOUS | Status: AC

## 2018-09-17 NOTE — Progress Notes (Signed)
Temps taken by Loa VS taken by CW 

## 2018-09-17 NOTE — Progress Notes (Signed)
PT had a seizure this week on 09/15/18. She is being followed by Dr. Sheppard Coil at [facility]. Dr. Tarri Glenn wants clearance prior to rescheduling her colonoscopy. IV was dc'd intact and patient verbalized understanding. Spoke with Tinnie Gens CMA as well. SM

## 2018-09-18 ENCOUNTER — Telehealth: Payer: Self-pay | Admitting: Emergency Medicine

## 2018-09-18 DIAGNOSIS — R1032 Left lower quadrant pain: Secondary | ICD-10-CM

## 2018-09-18 DIAGNOSIS — Z1211 Encounter for screening for malignant neoplasm of colon: Secondary | ICD-10-CM

## 2018-09-18 NOTE — Telephone Encounter (Signed)
   Kathleen Davis 02-17-1966 [id number]  Dear Dr. Sheppard Coil :  We have scheduled the above named patient for a(n) colonsocopy procedure. Our records show that (s)he is on antiseizure therapy and recently had seizure activity.  Please advise as to whether the patient is stable from your stand  Please route your response to Tinnie Gens, [place] or fax response to [phone].   Sincerely,    [facility]

## 2018-09-28 NOTE — Telephone Encounter (Signed)
Called and left message with Dr. Redgie Grayer staff to call back and let us know if patient is clear to proceed with colonoscopy.

## 2018-09-29 ENCOUNTER — Ambulatory Visit: Payer: Medicare Other | Admitting: Nurse Practitioner

## 2018-09-29 MED ORDER — NA SULFATE-K SULFATE-MG SULF 17.5-3.13-1.6 GM/177ML PO SOLN: 1.0000 | ORAL | 0 refills | Status: AC

## 2018-09-29 NOTE — Telephone Encounter (Signed)
Received ok for patient to proceed with colonoscopy from Dr. Sheppard Coil as long as she takes her AEDs regularly including the morning of the procedure.

## 2018-10-01 DIAGNOSIS — G40219 Localization-related (focal) (partial) symptomatic epilepsy and epileptic syndromes with complex partial seizures, intractable, without status epilepticus: Secondary | ICD-10-CM | POA: Diagnosis not present

## 2018-10-01 DIAGNOSIS — Z79899 Other long term (current) drug therapy: Secondary | ICD-10-CM | POA: Diagnosis not present

## 2018-10-02 DIAGNOSIS — F413 Other mixed anxiety disorders: Secondary | ICD-10-CM | POA: Diagnosis not present

## 2018-10-02 DIAGNOSIS — R41841 Cognitive communication deficit: Secondary | ICD-10-CM | POA: Diagnosis not present

## 2018-10-02 DIAGNOSIS — G9381 Temporal sclerosis: Secondary | ICD-10-CM | POA: Diagnosis not present

## 2018-10-02 DIAGNOSIS — R51 Headache: Secondary | ICD-10-CM | POA: Diagnosis not present

## 2018-10-02 DIAGNOSIS — G40109 Localization-related (focal) (partial) symptomatic epilepsy and epileptic syndromes with simple partial seizures, not intractable, without status epilepticus: Secondary | ICD-10-CM | POA: Diagnosis not present

## 2018-10-05 ENCOUNTER — Telehealth: Payer: Self-pay | Admitting: Gastroenterology

## 2018-10-05 NOTE — Telephone Encounter (Signed)

## 2018-10-06 ENCOUNTER — Encounter: Payer: Medicare Other | Admitting: Gastroenterology

## 2018-10-06 ENCOUNTER — Telehealth: Payer: Self-pay | Admitting: Gastroenterology

## 2018-10-06 NOTE — Telephone Encounter (Signed)
LMOM to call back to reschedule procedure.  

## 2018-10-22 ENCOUNTER — Telehealth: Payer: Self-pay | Admitting: Family Medicine

## 2018-10-22 NOTE — Telephone Encounter (Signed)
Attempted to call patient about her appointment on 9/4 @ 9:35. No answer left voicemail instructing patient to wear a face mask for the entire appointment and no visitors are allowed during the visit. Patient instructed not to attend the appointment if she was any symptoms. Symptom list and office number left.

## 2018-10-23 ENCOUNTER — Encounter: Payer: Self-pay | Admitting: Family Medicine

## 2018-10-23 ENCOUNTER — Ambulatory Visit: Payer: Medicare Other | Admitting: Family Medicine

## 2018-11-27 DIAGNOSIS — Z79899 Other long term (current) drug therapy: Secondary | ICD-10-CM | POA: Diagnosis not present

## 2018-11-27 DIAGNOSIS — G40109 Localization-related (focal) (partial) symptomatic epilepsy and epileptic syndromes with simple partial seizures, not intractable, without status epilepticus: Secondary | ICD-10-CM | POA: Diagnosis not present

## 2018-12-03 ENCOUNTER — Encounter: Payer: Self-pay | Admitting: *Deleted

## 2018-12-03 ENCOUNTER — Telehealth: Payer: Self-pay | Admitting: Gastroenterology

## 2018-12-03 NOTE — Telephone Encounter (Signed)
Pt wants to know if she can r/s her procedure that had to be cancelled because she needed to see a neurologist, she states that she already saw a neuro at [facility].

## 2018-12-04 ENCOUNTER — Emergency Department (HOSPITAL_COMMUNITY): Payer: Medicare Other

## 2018-12-04 ENCOUNTER — Emergency Department (HOSPITAL_COMMUNITY)
Admission: EM | Admit: 2018-12-04 | Discharge: 2018-12-04 | Disposition: A | Discharge status: Home / Self Care | Payer: Medicare Other | Attending: Emergency Medicine | Admitting: Emergency Medicine

## 2018-12-04 ENCOUNTER — Other Ambulatory Visit: Payer: Self-pay

## 2018-12-04 ENCOUNTER — Encounter (HOSPITAL_COMMUNITY): Payer: Self-pay | Admitting: Emergency Medicine

## 2018-12-04 DIAGNOSIS — I10 Essential (primary) hypertension: Secondary | ICD-10-CM | POA: Diagnosis not present

## 2018-12-04 DIAGNOSIS — Z79899 Other long term (current) drug therapy: Secondary | ICD-10-CM | POA: Diagnosis not present

## 2018-12-04 DIAGNOSIS — R0789 Other chest pain: Secondary | ICD-10-CM

## 2018-12-04 DIAGNOSIS — R079 Chest pain, unspecified: Secondary | ICD-10-CM | POA: Diagnosis not present

## 2018-12-04 DIAGNOSIS — F17210 Nicotine dependence, cigarettes, uncomplicated: Secondary | ICD-10-CM | POA: Diagnosis not present

## 2018-12-04 LAB — CBC
HCT: 40.6 % (ref 36.0–46.0)
Hemoglobin: 12.9 g/dL (ref 12.0–15.0)
MCH: 31.9 pg (ref 26.0–34.0)
MCHC: 31.8 g/dL (ref 30.0–36.0)
MCV: 100.2 fL — ABNORMAL HIGH (ref 80.0–100.0)
Platelets: 338 10*3/uL (ref 150–400)
RBC: 4.05 MIL/uL (ref 3.87–5.11)
RDW: 12.9 % (ref 11.5–15.5)
WBC: 7.7 10*3/uL (ref 4.0–10.5)
nRBC: 0 % (ref 0.0–0.2)

## 2018-12-04 LAB — TROPONIN I (HIGH SENSITIVITY)
Troponin I (High Sensitivity): 3 ng/L (ref <18)
Troponin I (High Sensitivity): 2 ng/L (ref <18)

## 2018-12-04 LAB — I-STAT BETA HCG BLOOD, ED (MC, WL, AP ONLY): I-stat hCG, quantitative: <5 m[IU]/mL (ref <5)

## 2018-12-04 LAB — BASIC METABOLIC PANEL
Anion gap: 11 (ref 5–15)
BUN: 16 mg/dL (ref 6–20)
CO2: 24 mmol/L (ref 22–32)
Calcium: 9.7 mg/dL (ref 8.9–10.3)
Chloride: 104 mmol/L (ref 98–111)
Creatinine, Ser: 0.98 mg/dL (ref 0.44–1.00)
GFR calc Af Amer: >60 mL/min (ref >60)
GFR calc non Af Amer: >60 mL/min (ref >60)
Glucose, Bld: 101 mg/dL — ABNORMAL HIGH (ref 70–99)
Potassium: 4.3 mmol/L (ref 3.5–5.1)
Sodium: 139 mmol/L (ref 135–145)

## 2018-12-04 MED ORDER — SODIUM CHLORIDE 0.9% FLUSH: 3.0000 mL | Freq: Once | INTRAVENOUS | Status: DC

## 2018-12-04 NOTE — ED Triage Notes (Signed)
States cp , left arm pain x 3 days had some nasea and her leg are swelling

## 2018-12-04 NOTE — ED Notes (Signed)
Patient Alert and oriented to baseline. Stable and ambulatory to baseline. Patient verbalized understanding of the discharge instructions.  Patient belongings were taken by the patient.   

## 2018-12-04 NOTE — ED Provider Notes (Signed)
[facility] EMERGENCY DEPARTMENT Provider Note   CSN: [id number] Arrival date & time: 12/04/18  1054     History   Chief Complaint Chief Complaint  Patient presents with  . Chest Pain    HPI Kathleen Davis is a 53 y.o. female.     HPI Patient presents to the emergency department with chest pain that started this morning.  Patient states she had an episode of chest pain that occurred this morning that lasted for 15 to 30 seconds.  The patient states that nothing seemed to make the condition worse.  The patient states that she did not take any medications prior to arrival for her symptoms.  Patient states that the pain did not radiate and she had no other symptoms associated with the pain.  Patient states that she does have epilepsy and she felt like she might be having a seizure so she sat down in a chair.  Patient did not lose consciousness.  Patient states that she is having multiple areas of pain but nothing that is acute.  Patient states that this chest pain did not radiate to her back or neck.  The patient denies  shortness of breath, headache,blurred vision, neck pain, fever, cough, weakness, numbness, dizziness, anorexia, edema, abdominal pain, nausea, vomiting, diarrhea, rash, back pain, dysuria, hematemesis, bloody stool, near syncope, or syncope. Past Medical History:  Diagnosis Date  . Chronic headaches   . Depression   . Hypertension   . Seizures ([place])    last seizure was 09/16/2018    Patient Active Problem List   Diagnosis Date Noted  . HTN (hypertension) 07/24/2018  . Seizure-like activity ([place]) 07/24/2018  . Tetanus toxoid vaccination within the past 10 years 04/04/2018  . 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 Migraine 09/19/2011  . SEIZURE DISORDER 04/02/2006    Past Surgical History:  Procedure Laterality Date  . TUBAL LIGATION       OB History    Gravida  4   Para      Term      Preterm       AB      Living  4     SAB      TAB      Ectopic      Multiple      Live Births  4            Home Medications    Prior to Admission medications   Medication Sig Start Date End Date Taking? Authorizing Provider  acetaminophen (TYLENOL) 325 MG tablet Take by mouth. 07/30/18   [provider]  amLODipine (NORVASC) 5 MG tablet Take 1 tablet (5 mg total) by mouth daily. Patient not taking: Reported on 09/17/2018 09/07/18   Gildardo Pounds, NP  furosemide (LASIX) 20 MG tablet Take 20 mg by mouth daily as needed. 10/14/18   [provider]  ibuprofen (ADVIL,MOTRIN) 200 MG tablet Take 400 mg by mouth every 6 (six) hours as needed for headache (pain).    [provider]  lamoTRIgine (LAMICTAL) 100 MG tablet Take 1 tablet (100 mg total) by mouth 2 (two) times daily. For mood stabilization 08/01/17   Lindell Spar I, NP  lamoTRIgine (LAMICTAL) 25 MG tablet Take 1 tablet (25 mg total) by mouth 2 times daily. Take in addition to 100mg  tablets (for total 125mg  twice daily). 10/14/18   [provider]  levETIRAcetam (KEPPRA) 1000 MG tablet Take 1 tablet (  1,000 mg total) by mouth 3 (three) times daily. For seizures 08/01/17   Armandina StammerNwoko, Agnes I, NP  magnesium oxide (MAG-OX) 400 MG tablet Take 1-2 tablets (400-800mg ) by mouth twice per day for headache prevention. Patient not taking: Reported on 09/07/2018 07/14/18   Claiborne RiggFleming, Zelda W, NP  ondansetron (ZOFRAN ODT) 4 MG disintegrating tablet Take 1 tablet (4 mg total) by mouth every 8 (eight) hours as needed for nausea or vomiting. Patient not taking: Reported on 09/07/2018 02/28/18   Street, GanadoMercedes, PA-C  OVER THE COUNTER MEDICATION Place 2 drops into both eyes 2 (two) times daily.    [provider]  SUMAtriptan (IMITREX) 50 MG tablet Take 1 tablet (50 mg total) by mouth every 2 (two) hours as needed for migraine. May repeat in 2 hours if headache persists or recurs. Patient not taking: Reported on  09/07/2018 04/01/18   Claiborne RiggFleming, Zelda W, NP  traZODone (DESYREL) 100 MG tablet Take 1 tablet (100 mg total) by mouth at bedtime as needed for sleep. 09/07/18 12/06/18  Claiborne RiggFleming, Zelda W, NP    Family History Family History  Problem Relation Age of Onset  . Hypertension Mother   . Diabetes Mother   . Stroke Mother   . Hypertension Father   . Cancer Father   . Seizures Father   . Hypertension Sister   . Diabetes Sister   . Breast cancer Sister   . Hypertension Brother   . Diabetes Brother   . Breast cancer Paternal Aunt     Social History Social History   Tobacco Use  . Smoking status: Current Every Day Smoker    Types: Cigarettes, Cigars  . Smokeless tobacco: Never Used  . Tobacco comment: quit about 3 months ago  Substance Use Topics  . Alcohol use: Never    Frequency: Never  . Drug use: Never     Allergies   Patient has no known allergies.   Review of Systems Review of Systems   Physical Exam Updated Vital Signs BP (!) 124/92 (BP Location: Right Arm)   Pulse 87   Temp 98.8 F (37.1 C) (Oral)   Resp (!) 25   LMP 06/13/2011   SpO2 97%   Physical Exam Vitals signs and nursing note reviewed.  Constitutional:      General: She is not in acute distress.    Appearance: She is well-developed.  HENT:     Head: Normocephalic and atraumatic.  Eyes:     Pupils: Pupils are equal, round, and reactive to light.  Neck:     Musculoskeletal: Normal range of motion and neck supple.  Cardiovascular:     Rate and Rhythm: Normal rate and regular rhythm.     Heart sounds: Normal heart sounds. No murmur. No friction rub. No gallop.   Pulmonary:     Effort: Pulmonary effort is normal. No respiratory distress.     Breath sounds: Normal breath sounds. No wheezing.  Abdominal:     General: Bowel sounds are normal. There is no distension.     Palpations: Abdomen is soft.     Tenderness: There is no abdominal tenderness.  Skin:    General: Skin is warm and dry.      Capillary Refill: Capillary refill takes less than 2 seconds.     Findings: No erythema or rash.  Neurological:     Mental Status: She is alert and oriented to person, place, and time.     Motor: No abnormal muscle tone.  Coordination: Coordination normal.  Psychiatric:        Behavior: Behavior normal.      ED Treatments / Results  Labs (all labs ordered are listed, but only abnormal results are displayed) Labs Reviewed  BASIC METABOLIC PANEL - Abnormal; Notable for the following components:      Result Value   Glucose, Bld 101 (*)    All other components within normal limits  CBC - Abnormal; Notable for the following components:   MCV 100.2 (*)    All other components within normal limits  I-STAT BETA HCG BLOOD, ED ([facility], [facility], [facility] ONLY)  TROPONIN I (HIGH SENSITIVITY)  TROPONIN I (HIGH SENSITIVITY)    EKG EKG Interpretation  Date/Time:  Friday December 04 2018 11:51:36 EDT Ventricular Rate:  90 PR Interval:  156 QRS Duration: 86 QT Interval:  352 QTC Calculation: 430 R Axis:   67 Text Interpretation:  Normal sinus rhythm Normal ECG Confirmed by Marianna Fuss ([id number]) on 12/04/2018 1:54:21 PM   Radiology Dg Chest 2 View  Result Date: 12/04/2018 CLINICAL DATA:  Chest pain with nausea EXAM: CHEST - 2 VIEW COMPARISON:  February 28, 2018 FINDINGS: No edema or consolidation. Heart size and pulmonary vascularity are normal. No adenopathy. No pneumothorax. No bone lesions. IMPRESSION: No edema or consolidation. Electronically Signed   By: Bretta Bang III M.D.   On: 12/04/2018 12:18    Procedures Procedures (including critical care time)  Medications Ordered in ED Medications  sodium chloride flush (NS) 0.9 % injection 3 mL (has no administration in time range)     Initial Impression / Assessment and Plan / ED Course  I have reviewed the triage vital signs and the nursing notes.  Pertinent labs & imaging results that were available during my care of the  patient were reviewed by me and considered in my medical decision making (see chart for details).       Patient's symptoms seem fairly atypical for cardiac chest pain based on her HPI and physical exam findings.  Patient's pain lasted less than 30 seconds.  Patient states that she has not had any reoccurrence of the pain since that time.  The nursing note mention that it was 3 days ago.  But this episode occurred this morning.  Patient states that she has had chest pain in the past.  She states that she has not seen a cardiologist in the past.  Patient will be discharged home and referred to her primary doctor for reevaluation and recheck.  I advised the patient to return to the emergency department for any worsening in her condition.  Final Clinical Impressions(s) / ED Diagnoses   Final diagnoses:  None    ED Discharge Orders    None       Charlestine Night, PA-C 12/04/18 1551    Milagros Loll, MD 12/05/18 1202

## 2018-12-04 NOTE — Discharge Instructions (Addendum)
Return here return here as needed.  Follow-up with your primary care doctor.  Your testing here today did not show any heart related damage.

## 2018-12-04 NOTE — Telephone Encounter (Signed)
Left message for the patient to call back.

## 2018-12-07 ENCOUNTER — Ambulatory Visit: Payer: Medicare Other | Admitting: Nurse Practitioner

## 2018-12-07 NOTE — Telephone Encounter (Signed)
Was under the impression that the patient wanted to reschedule her screening colonoscopy from the telephone message from the schedulers, but spoke with the patient who reported she called to inform Dr. Tarri Glenn that she was told by her neurologist that she should not go through with the procedure due to her seizures. Documentation in [place].

## 2018-12-08 ENCOUNTER — Encounter: Payer: Medicare Other | Admitting: Gastroenterology

## 2018-12-08 NOTE — Telephone Encounter (Signed)
Left message for the patient to call back.

## 2018-12-08 NOTE — Telephone Encounter (Signed)
Please schedule a follow-up visit with me or an APP. Thanks.

## 2018-12-14 ENCOUNTER — Encounter: Payer: Self-pay | Admitting: *Deleted

## 2018-12-14 NOTE — Telephone Encounter (Signed)
Spoke to patient, patient scheduled to see Dr. Tarri Glenn on 11/18 at 10:50 am.

## 2019-01-06 ENCOUNTER — Ambulatory Visit: Payer: Medicare Other | Admitting: Gastroenterology

## 2019-01-19 ENCOUNTER — Other Ambulatory Visit: Payer: Self-pay

## 2019-01-19 ENCOUNTER — Encounter: Payer: Self-pay | Admitting: Nurse Practitioner

## 2019-01-19 ENCOUNTER — Ambulatory Visit: Payer: Medicare Other | Attending: Nurse Practitioner | Admitting: Nurse Practitioner

## 2019-01-19 DIAGNOSIS — E669 Obesity, unspecified: Secondary | ICD-10-CM | POA: Diagnosis not present

## 2019-01-19 DIAGNOSIS — R519 Headache, unspecified: Secondary | ICD-10-CM | POA: Insufficient documentation

## 2019-01-19 DIAGNOSIS — R0789 Other chest pain: Secondary | ICD-10-CM | POA: Diagnosis not present

## 2019-01-19 DIAGNOSIS — R110 Nausea: Secondary | ICD-10-CM | POA: Diagnosis not present

## 2019-01-19 DIAGNOSIS — I10 Essential (primary) hypertension: Secondary | ICD-10-CM

## 2019-01-19 DIAGNOSIS — F17200 Nicotine dependence, unspecified, uncomplicated: Secondary | ICD-10-CM | POA: Insufficient documentation

## 2019-01-19 DIAGNOSIS — F17210 Nicotine dependence, cigarettes, uncomplicated: Secondary | ICD-10-CM

## 2019-01-19 DIAGNOSIS — Z8249 Family history of ischemic heart disease and other diseases of the circulatory system: Secondary | ICD-10-CM | POA: Diagnosis not present
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
## 2019-02-05 ENCOUNTER — Telehealth: Payer: Self-pay | Admitting: Nurse Practitioner

## 2019-02-11 ENCOUNTER — Ambulatory Visit: Payer: Medicare Other | Admitting: Gastroenterology

## 2019-02-14 NOTE — Progress Notes (Deleted)
Cardiology Office Note:   Date:  02/14/2019  NAME:  Kathleen Davis    MRN: [id number] DOB:  19-Aug-1965   PCP:  Kathleen Rigg, NP  Cardiologist:  No primary care provider on file.  Electrophysiologist:  None   Referring MD: Kathleen Rigg, NP   No chief complaint on file. ***  History of Present Illness:   Kathleen Davis is a 53 y.o. female with a hx of hypertension, tobacco abuse who is being seen today for the evaluation of chest pain at the request of Kathleen Rigg, NP.  She was seen in the emergency room on 12/04/2018 for chest pain.  EKG was unremarkable and troponins were negative for myocardial infarction.  Past Medical History: Past Medical History:  Diagnosis Date  . Chronic headaches   . Depression   . Hypertension   . Seizures ([facility])    last seizure was 09/16/2018    Past Surgical History: Past Surgical History:  Procedure Laterality Date  . TUBAL LIGATION      Current Medications: No outpatient medications have been marked as taking for the 02/15/19 encounter (Appointment) with Sande Rives, MD.   Current Facility-Administered Medications for the 02/15/19 encounter (Appointment) with O'Neal, Ronnald Ramp, MD  Medication  . 0.9 %  sodium chloride infusion     Allergies:    Patient has no known allergies.   Social History: Social History   Socioeconomic History  . Marital status: Single    Spouse name: Not on file  . Number of children: Not on file  . Years of education: Not on file  . Highest education level: Not on file  Occupational History  . Not on file  Tobacco Use  . Smoking status: Current Every Day Smoker    Types: Cigars  . Smokeless tobacco: Never Used  . Tobacco comment: smokes 1 black and mild  a day  Substance and Sexual Activity  . Alcohol use: Never  . Drug use: Never  . Sexual activity: Yes  Other Topics Concern  . Not on file  Social History Narrative  . Not on file   Social Determinants of Health   Financial  Resource Strain:   . Difficulty of Paying Living Expenses: Not on file  Food Insecurity:   . Worried About Programme researcher, broadcasting/film/video in the Last Year: Not on file  . Ran Out of Food in the Last Year: Not on file  Transportation Needs:   . Lack of Transportation (Medical): Not on file  . Lack of Transportation (Non-Medical): Not on file  Physical Activity:   . Days of Exercise per Week: Not on file  . Minutes of Exercise per Session: Not on file  Stress:   . Feeling of Stress : Not on file  Social Connections:   . Frequency of Communication with Friends and Family: Not on file  . Frequency of Social Gatherings with Friends and Family: Not on file  . Attends Religious Services: Not on file  . Active Member of Clubs or Organizations: Not on file  . Attends Banker Meetings: Not on file  . Marital Status: Not on file     Family History: The patient's ***family history includes Breast cancer in her paternal aunt and sister; Cancer in her father; Diabetes in her brother, mother, and sister; Hypertension in her brother, father, mother, and sister; Seizures in her father; Stroke in her mother.  ROS:   All other ROS reviewed and negative. Pertinent positives noted  in the HPI.     EKGs/Labs/Other Studies Reviewed:   The following studies were personally reviewed by me today:  EKG:  EKG is *** ordered today.  The ekg ordered today demonstrates ***, and was personally reviewed by me.   Recent Labs: 02/28/2018: ALT 14 12/04/2018: BUN 16; Creatinine, Ser 0.98; Hemoglobin 12.9; Platelets 338; Potassium 4.3; Sodium 139   Recent Lipid Panel    Component Value Date/Time   CHOL 200 02/24/2007 2048   TRIG 121 02/24/2007 2048   HDL 53 02/24/2007 2048   CHOLHDL 3.8 Ratio 02/24/2007 2048   VLDL 24 02/24/2007 2048   LDLCALC 123 (H) 02/24/2007 2048    Physical Exam:   VS:  LMP 06/13/2011    Wt Readings from Last 3 Encounters:  09/17/18 222 lb (100.7 kg)  09/07/18 224 lb (101.6 kg)    08/13/18 222 lb (100.7 kg)    General: Well nourished, well developed, in no acute distress Heart: Atraumatic, normal size  Eyes: PEERLA, EOMI  Neck: Supple, no JVD Endocrine: No thryomegaly Cardiac: Normal S1, S2; RRR; no murmurs, rubs, or gallops Lungs: Clear to auscultation bilaterally, no wheezing, rhonchi or rales  Abd: Soft, nontender, no hepatomegaly  Ext: No edema, pulses 2+ Musculoskeletal: No deformities, BUE and BLE strength normal and equal Skin: Warm and dry, no rashes   Neuro: Alert and oriented to person, place, time, and situation, CNII-XII grossly intact, no focal deficits  Psych: Normal mood and affect   ASSESSMENT:   [name] is a 53 y.o. female who presents for the following: No diagnosis found.  PLAN:   There are no diagnoses linked to this encounter.  Disposition: No follow-ups on file.  Medication Adjustments/Labs and Tests Ordered: Current medicines are reviewed at length with the patient today.  Concerns regarding medicines are outlined above.  No orders of the defined types were placed in this encounter.  No orders of the defined types were placed in this encounter.   There are no Patient Instructions on file for this visit.   Signed, Addison Naegeli. Audie Box, [place]  [address], [place], [place] [address] [phone]  02/14/2019 2:23 PM

## 2019-02-15 ENCOUNTER — Ambulatory Visit: Payer: Medicare Other | Admitting: Cardiovascular Disease

## 2019-03-03 NOTE — Progress Notes (Deleted)
Cardiology Office Note:   Date:  03/03/2019  NAME:  Kathleen Davis    MRN: [id number] DOB:  19-Oct-1965   PCP:  Claiborne Rigg, NP  Cardiologist:  Reatha Harps, MD  Electrophysiologist:  None   Referring MD: Claiborne Rigg, NP   No chief complaint on file. ***  History of Present Illness:   Kathleen Davis is a 54 y.o. female with a hx of hypertension who is being seen today for the evaluation of chest pain at the request of Claiborne Rigg, NP. Evaluated in the ER 12/04/2018 for atypical chest pain. Rule out for MI. EKG without acute ischemic changes. Evaluated by PCP. Sent for cardiology evaluation.   Past Medical History: Past Medical History:  Diagnosis Date  . Chronic headaches   . Depression   . Hypertension   . Seizures (HCC)    last seizure was 09/16/2018    Past Surgical History: Past Surgical History:  Procedure Laterality Date  . TUBAL LIGATION      Current Medications: No outpatient medications have been marked as taking for the 03/05/19 encounter (Appointment) with Sande Rives, MD.   Current Facility-Administered Medications for the 03/05/19 encounter (Appointment) with O'Neal, Ronnald Ramp, MD  Medication  . 0.9 %  sodium chloride infusion     Allergies:    Patient has no known allergies.   Social History: Social History   Socioeconomic History  . Marital status: Single    Spouse name: Not on file  . Number of children: Not on file  . Years of education: Not on file  . Highest education level: Not on file  Occupational History  . Not on file  Tobacco Use  . Smoking status: Current Every Day Smoker    Types: Cigars  . Smokeless tobacco: Never Used  . Tobacco comment: smokes 1 black and mild  a day  Substance and Sexual Activity  . Alcohol use: Never  . Drug use: Never  . Sexual activity: Yes  Other Topics Concern  . Not on file  Social History Narrative  . Not on file   Social Determinants of Health   Financial Resource Strain:    . Difficulty of Paying Living Expenses: Not on file  Food Insecurity:   . Worried About Programme researcher, broadcasting/film/video in the Last Year: Not on file  . Ran Out of Food in the Last Year: Not on file  Transportation Needs:   . Lack of Transportation (Medical): Not on file  . Lack of Transportation (Non-Medical): Not on file  Physical Activity:   . Days of Exercise per Week: Not on file  . Minutes of Exercise per Session: Not on file  Stress:   . Feeling of Stress : Not on file  Social Connections:   . Frequency of Communication with Friends and Family: Not on file  . Frequency of Social Gatherings with Friends and Family: Not on file  . Attends Religious Services: Not on file  . Active Member of Clubs or Organizations: Not on file  . Attends Banker Meetings: Not on file  . Marital Status: Not on file     Family History: The patient's ***family history includes Breast cancer in her paternal aunt and sister; Cancer in her father; Diabetes in her brother, mother, and sister; Hypertension in her brother, father, mother, and sister; Seizures in her father; Stroke in her mother.  ROS:   All other ROS reviewed and negative. Pertinent positives noted in the  HPI.     EKGs/Labs/Other Studies Reviewed:   The following studies were personally reviewed by me today:  EKG:  EKG is *** ordered today.  The ekg ordered today demonstrates ***, and was personally reviewed by me.   Recent Labs: 12/04/2018: BUN 16; Creatinine, Ser 0.98; Hemoglobin 12.9; Platelets 338; Potassium 4.3; Sodium 139   Recent Lipid Panel    Component Value Date/Time   CHOL 200 02/24/2007 2048   TRIG 121 02/24/2007 2048   HDL 53 02/24/2007 2048   CHOLHDL 3.8 Ratio 02/24/2007 2048   VLDL 24 02/24/2007 2048   LDLCALC 123 (H) 02/24/2007 2048    Physical Exam:   VS:  LMP 06/13/2011    Wt Readings from Last 3 Encounters:  09/17/18 222 lb (100.7 kg)  09/07/18 224 lb (101.6 kg)  08/13/18 222 lb (100.7 kg)      General: Well nourished, well developed, in no acute distress Heart: Atraumatic, normal size  Eyes: PEERLA, EOMI  Neck: Supple, no JVD Endocrine: No thryomegaly Cardiac: Normal S1, S2; RRR; no murmurs, rubs, or gallops Lungs: Clear to auscultation bilaterally, no wheezing, rhonchi or rales  Abd: Soft, nontender, no hepatomegaly  Ext: No edema, pulses 2+ Musculoskeletal: No deformities, BUE and BLE strength normal and equal Skin: Warm and dry, no rashes   Neuro: Alert and oriented to person, place, time, and situation, CNII-XII grossly intact, no focal deficits  Psych: Normal mood and affect   ASSESSMENT:   Kathleen Davis is a 54 y.o. female who presents for the following: No diagnosis found.  PLAN:   There are no diagnoses linked to this encounter.  Disposition: No follow-ups on file.  Medication Adjustments/Labs and Tests Ordered: Current medicines are reviewed at length with the patient today.  Concerns regarding medicines are outlined above.  No orders of the defined types were placed in this encounter.  No orders of the defined types were placed in this encounter.   There are no Patient Instructions on file for this visit.   Signed, Addison Naegeli. Audie Box, [place]  [address], [place],  [address] [phone]  03/03/2019 1:37 PM

## 2019-03-05 ENCOUNTER — Ambulatory Visit: Payer: Medicare Other | Admitting: Cardiovascular Disease

## 2019-03-16 ENCOUNTER — Emergency Department (HOSPITAL_COMMUNITY): Payer: Medicare Other

## 2019-03-16 ENCOUNTER — Emergency Department (HOSPITAL_COMMUNITY)
Admission: EM | Admit: 2019-03-16 | Discharge: 2019-03-16 | Disposition: A | Discharge status: Home / Self Care | Payer: Medicare Other | Attending: Emergency Medicine | Admitting: Emergency Medicine

## 2019-03-16 ENCOUNTER — Other Ambulatory Visit: Payer: Self-pay

## 2019-03-16 DIAGNOSIS — I10 Essential (primary) hypertension: Secondary | ICD-10-CM | POA: Diagnosis not present

## 2019-03-16 DIAGNOSIS — F17210 Nicotine dependence, cigarettes, uncomplicated: Secondary | ICD-10-CM | POA: Diagnosis not present

## 2019-03-16 DIAGNOSIS — Z79899 Other long term (current) drug therapy: Secondary | ICD-10-CM | POA: Diagnosis not present

## 2019-03-16 DIAGNOSIS — G40909 Epilepsy, unspecified, not intractable, without status epilepticus: Secondary | ICD-10-CM | POA: Diagnosis not present

## 2019-03-16 DIAGNOSIS — R519 Headache, unspecified: Secondary | ICD-10-CM | POA: Insufficient documentation

## 2019-03-16 LAB — URINALYSIS, ROUTINE W REFLEX MICROSCOPIC
Bacteria, UA: NONE SEEN
Bilirubin Urine: NEGATIVE
Glucose, UA: NEGATIVE mg/dL
Ketones, ur: NEGATIVE mg/dL
Leukocytes,Ua: NEGATIVE
Nitrite: NEGATIVE
Protein, ur: NEGATIVE mg/dL
Specific Gravity, Urine: 1.011 (ref 1.005–1.030)
pH: 6 (ref 5.0–8.0)

## 2019-03-16 LAB — CBC WITH DIFFERENTIAL/PLATELET
Abs Immature Granulocytes: 0.01 10*3/uL (ref 0.00–0.07)
Basophils Absolute: 0 10*3/uL (ref 0.0–0.1)
Basophils Relative: 1 %
Eosinophils Absolute: 0 10*3/uL (ref 0.0–0.5)
Eosinophils Relative: 1 %
HCT: 41.2 % (ref 36.0–46.0)
Hemoglobin: 12.9 g/dL (ref 12.0–15.0)
Immature Granulocytes: 0 %
Lymphocytes Relative: 39 %
Lymphs Abs: 2.4 10*3/uL (ref 0.7–4.0)
MCH: 31.6 pg (ref 26.0–34.0)
MCHC: 31.3 g/dL (ref 30.0–36.0)
MCV: 101 fL — ABNORMAL HIGH (ref 80.0–100.0)
Monocytes Absolute: 0.5 10*3/uL (ref 0.1–1.0)
Monocytes Relative: 8 %
Neutro Abs: 3.3 10*3/uL (ref 1.7–7.7)
Neutrophils Relative %: 51 %
Platelets: 401 10*3/uL — ABNORMAL HIGH (ref 150–400)
RBC: 4.08 MIL/uL (ref 3.87–5.11)
RDW: 12.1 % (ref 11.5–15.5)
WBC: 6.3 10*3/uL (ref 4.0–10.5)
nRBC: 0 % (ref 0.0–0.2)

## 2019-03-16 LAB — COMPREHENSIVE METABOLIC PANEL
ALT: 14 U/L (ref 0–44)
AST: 20 U/L (ref 15–41)
Albumin: 4 g/dL (ref 3.5–5.0)
Alkaline Phosphatase: 87 U/L (ref 38–126)
Anion gap: 11 (ref 5–15)
BUN: 13 mg/dL (ref 6–20)
CO2: 26 mmol/L (ref 22–32)
Calcium: 9.5 mg/dL (ref 8.9–10.3)
Chloride: 102 mmol/L (ref 98–111)
Creatinine, Ser: 0.89 mg/dL (ref 0.44–1.00)
GFR calc Af Amer: >60 mL/min (ref >60)
GFR calc non Af Amer: >60 mL/min (ref >60)
Glucose, Bld: 100 mg/dL — ABNORMAL HIGH (ref 70–99)
Potassium: 3.9 mmol/L (ref 3.5–5.1)
Sodium: 139 mmol/L (ref 135–145)
Total Bilirubin: 0.1 mg/dL — ABNORMAL LOW (ref 0.3–1.2)
Total Protein: 7.6 g/dL (ref 6.5–8.1)

## 2019-03-16 NOTE — ED Provider Notes (Signed)
[facility] EMERGENCY DEPARTMENT Provider Note   CSN: [id number] Arrival date & time: 03/16/19  0941     History Chief Complaint  Patient presents with  . Headache    Kathleen Davis is a 54 y.o. female.  The history is provided by the patient. No language interpreter was used.  Headache Pain location:  Generalized Radiates to:  Does not radiate Severity currently:  Unable to specify Severity at highest:  Unable to specify Onset quality:  Gradual Timing:  Constant Progression:  Worsening Similar to prior headaches: no   Relieved by:  Nothing Worsened by:  Nothing Associated symptoms: no blurred vision, no fever, no hearing loss, no near-syncope, no visual change and no weakness   Pt reports she has a history of seizures.  Pt reports she awoke this am with a headache.  Pt unsure if she had a seizure.  Pt denies missing any medication.  Pt is followed at [facility].  Pt reports she has a hole on her brain that she may need surgery for,  Records reviewed and pt does not have any procedure scheduled      Past Medical History:  Diagnosis Date  . Chronic headaches   . Depression   . Hypertension   . Seizures (HCC)    last seizure was 09/16/2018    Patient Active Problem List   Diagnosis Date Noted  . HTN (hypertension) 07/24/2018  . Seizure-like activity (HCC) 07/24/2018  . Tetanus toxoid vaccination within the past 10 years 04/04/2018  . 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 Migraine 09/19/2011  . SEIZURE DISORDER 04/02/2006    Past Surgical History:  Procedure Laterality Date  . TUBAL LIGATION       OB History    Gravida  4   Para      Term      Preterm      AB      Living  4     SAB      TAB      Ectopic      Multiple      Live Births  4           Family History  Problem Relation Age of Onset  . Hypertension Mother   . Diabetes Mother   . Stroke Mother   . Hypertension  Father   . Cancer Father   . Seizures Father   . Hypertension Sister   . Diabetes Sister   . Breast cancer Sister   . Hypertension Brother   . Diabetes Brother   . Breast cancer Paternal Aunt     Social History   Tobacco Use  . Smoking status: Current Every Day Smoker    Types: Cigars  . Smokeless tobacco: Never Used  . Tobacco comment: smokes 1 black and mild  a day  Substance Use Topics  . Alcohol use: Never  . Drug use: Never    Home Medications Prior to Admission medications   Medication Sig Start Date End Date Taking? Authorizing Provider  acetaminophen (TYLENOL) 500 MG tablet Take 1,000 mg by mouth every 4 (four) hours as needed for mild pain or headache.   Yes [provider]  amLODipine (NORVASC) 5 MG tablet Take 1 tablet (5 mg total) by mouth daily. 09/07/18  Yes Claiborne Rigg, NP  furosemide (LASIX) 20 MG tablet Take 20 mg by mouth daily as needed for fluid.  10/14/18  Yes [provider]  ibuprofen (ADVIL,MOTRIN) 200 MG tablet Take 400 mg by mouth every 6 (six) hours as needed for headache (pain).   Yes [provider]  lamoTRIgine (LAMICTAL) 100 MG tablet Take 1 tablet (100 mg total) by mouth 2 (two) times daily. For mood stabilization 08/01/17  Yes Nwoko, Nicole Kindred I, NP  levETIRAcetam (KEPPRA) 1000 MG tablet Take 1 tablet (1,000 mg total) by mouth 3 (three) times daily. For seizures Patient taking differently: Take 1,500 mg by mouth 2 (two) times daily. For seizures 08/01/17  Yes Armandina Stammer I, NP  magnesium oxide (MAG-OX) 400 MG tablet Take 1-2 tablets (400-800mg ) by mouth twice per day for headache prevention. Patient not taking: Reported on 09/07/2018 07/14/18   Claiborne Rigg, NP  ondansetron (ZOFRAN ODT) 4 MG disintegrating tablet Take 1 tablet (4 mg total) by mouth every 8 (eight) hours as needed for nausea or vomiting. Patient not taking: Reported on 09/07/2018 02/28/18   Street, [place], PA-C  SUMAtriptan (IMITREX) 50 MG tablet Take 1  tablet (50 mg total) by mouth every 2 (two) hours as needed for migraine. May repeat in 2 hours if headache persists or recurs. Patient not taking: Reported on 09/07/2018 04/01/18   Claiborne Rigg, NP  traZODone (DESYREL) 100 MG tablet Take 1 tablet (100 mg total) by mouth at bedtime as needed for sleep. 09/07/18 12/06/18  Claiborne Rigg, NP    Allergies    Patient has no known allergies.  Review of Systems   Review of Systems  Constitutional: Negative for fever.  HENT: Negative for hearing loss.   Eyes: Negative for blurred vision.  Cardiovascular: Negative for near-syncope.  Neurological: Positive for headaches. Negative for weakness.  All other systems reviewed and are negative.   Physical Exam Updated Vital Signs BP (!) 132/99   Pulse 68   Temp 98.5 F (36.9 C) (Oral)   Resp 19   Ht 5\' 2"  (1.575 m)   Wt 83.9 kg   LMP 06/13/2011   SpO2 99%   BMI 33.84 kg/m   Physical Exam Vitals and nursing note reviewed.  Constitutional:      Appearance: She is well-developed.  HENT:     Head: Normocephalic.     Mouth/Throat:     Mouth: Mucous membranes are moist.  Eyes:     Extraocular Movements: Extraocular movements intact.     Pupils: Pupils are equal, round, and reactive to light.  Cardiovascular:     Rate and Rhythm: Normal rate and regular rhythm.  Pulmonary:     Effort: Pulmonary effort is normal.  Abdominal:     General: There is no distension.  Musculoskeletal:        General: Normal range of motion.     Cervical back: Normal range of motion.  Skin:    General: Skin is warm.  Neurological:     Mental Status: She is alert and oriented to person, place, and time.  Psychiatric:        Mood and Affect: Mood normal.     ED Results / Procedures / Treatments   Labs (all labs ordered are listed, but only abnormal results are displayed) Labs Reviewed  CBC WITH DIFFERENTIAL/PLATELET - Abnormal; Notable for the following components:      Result Value   MCV 101.0  (*)    Platelets 401 (*)    All other components within normal limits  COMPREHENSIVE METABOLIC PANEL - Abnormal; Notable for the following components:   Glucose, Bld 100 (*)  Total Bilirubin 0.1 (*)    All other components within normal limits  URINALYSIS, ROUTINE W REFLEX MICROSCOPIC - Abnormal; Notable for the following components:   Hgb urine dipstick SMALL (*)    All other components within normal limits    EKG None  Radiology CT Head Wo Contrast  Result Date: 03/16/2019 CLINICAL DATA:  Headache today.  History of epilepsy. EXAM: CT HEAD WITHOUT CONTRAST TECHNIQUE: Contiguous axial images were obtained from the base of the skull through the vertex without intravenous contrast. COMPARISON:  07/31/2017. FINDINGS: Brain: The brain shows a normal appearance without evidence of malformation, atrophy, old or acute small or large vessel infarction, mass lesion, hemorrhage, hydrocephalus or extra-axial collection. Vascular: No hyperdense vessel. No evidence of atherosclerotic calcification. Skull: Normal.  No traumatic finding.  No focal bone lesion. Sinuses/Orbits: Sinuses are clear. Orbits appear normal. Mastoids are clear. Other: Some density in the subcutaneous fat of the right parietal region which is nonspecific and unchanged since 2019, not significant IMPRESSION: Normal study. Electronically Signed   By: Nelson Chimes M.D.   On: 03/16/2019 13:28    Procedures Procedures (including critical care time)  Medications Ordered in ED Medications - No data to display  ED Course  I have reviewed the triage vital signs and the nursing notes.  Pertinent labs & imaging results that were available during my care of the patient were reviewed by me and considered in my medical decision making (see chart for details).    MDM Rules/Calculators/A&P                      MDM   Ct no acute abnormality,  Labs reviewed.  Pt reports headache resolving and has resolved after Ct.   Pt advised to  follow up with her MD for recheck.  Final Clinical Impression(s) / ED Diagnoses Final diagnoses:  Nonintractable headache, unspecified chronicity pattern, unspecified headache type    Rx / DC Orders ED Discharge Orders    None    An After Visit Summary was printed and given to the patient.    Fransico Meadow, [place] 03/16/19 [place], MD 03/17/19 [phone]

## 2019-03-16 NOTE — Discharge Instructions (Signed)
Follow up with your Physician for recheck  

## 2019-03-16 NOTE — ED Triage Notes (Signed)
Pt reports woke with headache this morning, hx of epilepsy. Alert, oriented x4. Denies blurred vision, double vision. C/o nausea, ambulatory.

## 2019-03-23 ENCOUNTER — Ambulatory Visit: Payer: Medicare Other | Admitting: Gastroenterology

## 2019-03-31 ENCOUNTER — Ambulatory Visit: Payer: Medicare Other | Admitting: Cardiovascular Disease

## 2019-04-04 NOTE — Telephone Encounter (Signed)
Signed.

## 2019-04-07 ENCOUNTER — Other Ambulatory Visit: Payer: Self-pay | Admitting: Nurse Practitioner

## 2019-04-07 DIAGNOSIS — I10 Essential (primary) hypertension: Secondary | ICD-10-CM

## 2019-04-09 DIAGNOSIS — G40109 Localization-related (focal) (partial) symptomatic epilepsy and epileptic syndromes with simple partial seizures, not intractable, without status epilepticus: Secondary | ICD-10-CM | POA: Diagnosis not present

## 2019-05-26 DIAGNOSIS — R42 Dizziness and giddiness: Secondary | ICD-10-CM | POA: Diagnosis not present

## 2019-05-26 DIAGNOSIS — R251 Tremor, unspecified: Secondary | ICD-10-CM | POA: Diagnosis not present

## 2019-05-26 DIAGNOSIS — G40109 Localization-related (focal) (partial) symptomatic epilepsy and epileptic syndromes with simple partial seizures, not intractable, without status epilepticus: Secondary | ICD-10-CM | POA: Diagnosis not present

## 2019-05-26 DIAGNOSIS — R9401 Abnormal electroencephalogram [EEG]: Secondary | ICD-10-CM | POA: Diagnosis not present

## 2019-05-27 DIAGNOSIS — G40802 Other epilepsy, not intractable, without status epilepticus: Secondary | ICD-10-CM | POA: Diagnosis not present

## 2019-05-28 DIAGNOSIS — G40802 Other epilepsy, not intractable, without status epilepticus: Secondary | ICD-10-CM | POA: Diagnosis not present

## 2019-06-09 ENCOUNTER — Other Ambulatory Visit: Payer: Self-pay

## 2019-06-09 ENCOUNTER — Emergency Department (HOSPITAL_BASED_OUTPATIENT_CLINIC_OR_DEPARTMENT_OTHER): Payer: Medicare Other

## 2019-06-09 ENCOUNTER — Encounter (HOSPITAL_COMMUNITY): Payer: Self-pay | Admitting: Emergency Medicine

## 2019-06-09 ENCOUNTER — Emergency Department (HOSPITAL_COMMUNITY)
Admission: EM | Admit: 2019-06-09 | Discharge: 2019-06-09 | Disposition: A | Discharge status: Home / Self Care | Payer: Medicare Other | Attending: Emergency Medicine | Admitting: Emergency Medicine

## 2019-06-09 DIAGNOSIS — F17290 Nicotine dependence, other tobacco product, uncomplicated: Secondary | ICD-10-CM | POA: Diagnosis not present

## 2019-06-09 DIAGNOSIS — R609 Edema, unspecified: Secondary | ICD-10-CM | POA: Diagnosis not present

## 2019-06-09 DIAGNOSIS — M79661 Pain in right lower leg: Secondary | ICD-10-CM | POA: Insufficient documentation

## 2019-06-09 DIAGNOSIS — Z79899 Other long term (current) drug therapy: Secondary | ICD-10-CM | POA: Diagnosis not present

## 2019-06-09 DIAGNOSIS — I10 Essential (primary) hypertension: Secondary | ICD-10-CM | POA: Diagnosis not present

## 2019-06-09 DIAGNOSIS — M79604 Pain in right leg: Secondary | ICD-10-CM

## 2019-06-09 DIAGNOSIS — J029 Acute pharyngitis, unspecified: Secondary | ICD-10-CM | POA: Diagnosis not present

## 2019-06-09 LAB — GROUP A STREP BY PCR: Group A Strep by PCR: NOT DETECTED

## 2019-06-09 NOTE — ED Notes (Signed)
All appropriate discharge materials reviewed at length with patient. Time for questions provided. Pt has no other questions at this time and verbalizes understanding of all provided materials.  

## 2019-06-09 NOTE — ED Notes (Signed)
Pt taking daily seizure medication. Provider notified.

## 2019-06-09 NOTE — Progress Notes (Signed)
Right lower extremity venous duplex completed. Refer to "CV Proc" under chart review to view preliminary results.  06/09/2019 3:08 PM Eula Fried., MHA, RVT, RDCS, RDMS

## 2019-06-09 NOTE — ED Notes (Signed)
Pt went to [facility]  
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
## 2019-06-09 NOTE — Discharge Instructions (Addendum)
Contact a health care provider if: Your leg cramps get more severe or more frequent, or they do not improve over time. Your foot becomes cold, numb, or blue. Get help right away if: You have difficulty breathing. You cannot swallow fluids, soft foods, or your saliva. You have increased swelling in your throat or neck. You have persistent nausea and vomiting.
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
## 2019-07-07 DIAGNOSIS — R404 Transient alteration of awareness: Secondary | ICD-10-CM | POA: Diagnosis not present

## 2019-07-07 DIAGNOSIS — R4020 Unspecified coma: Secondary | ICD-10-CM | POA: Diagnosis not present

## 2019-07-07 DIAGNOSIS — T751XXA Unspecified effects of drowning and nonfatal submersion, initial encounter: Secondary | ICD-10-CM | POA: Diagnosis not present

## 2019-07-16 ENCOUNTER — Ambulatory Visit: Payer: Medicare Other | Admitting: Nurse Practitioner

## 2019-07-20 DIAGNOSIS — 419620001 Death: Secondary | SNOMED CT | POA: Diagnosis not present

## 2019-07-20 DEATH — deceased

## 2021-02-12 IMAGING — MG DIGITAL SCREENING BILATERAL MAMMOGRAM WITH TOMO AND CAD
8 series · 8 of 24 positions shown · non-contrast
Comparison: None.

CLINICAL DATA: Screening.

EXAM:
DIGITAL SCREENING BILATERAL MAMMOGRAM WITH TOMO AND CAD

[L MLO synth-2D]
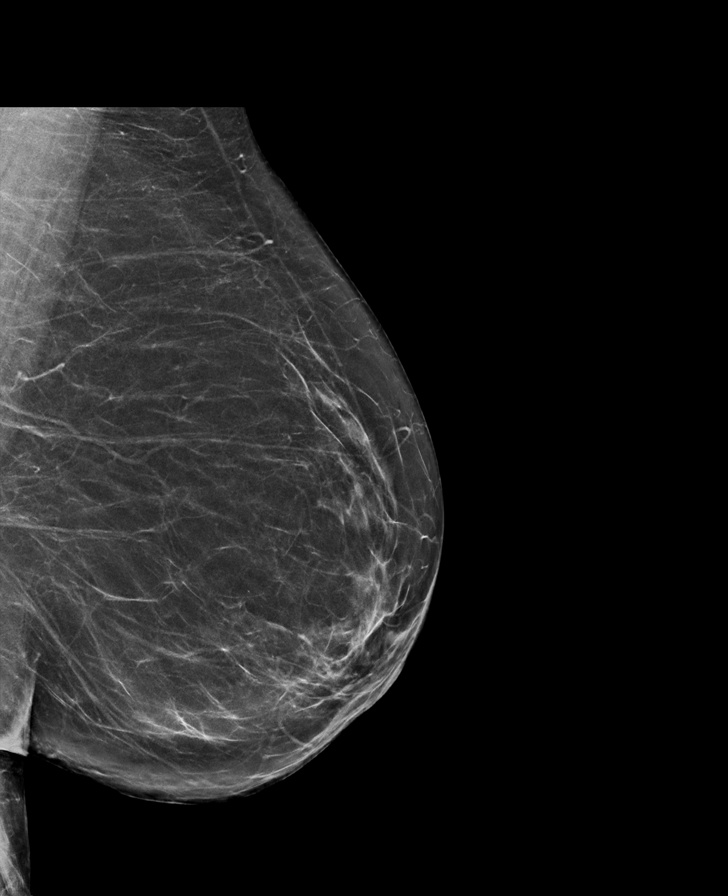

[R CC synth-2D]
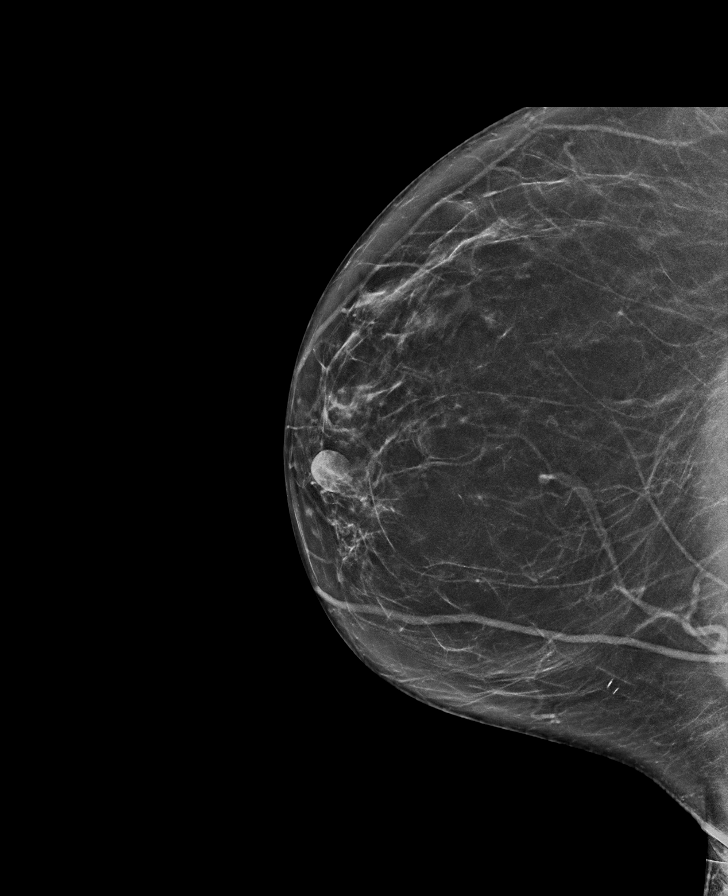

[R MLO synth-2D]
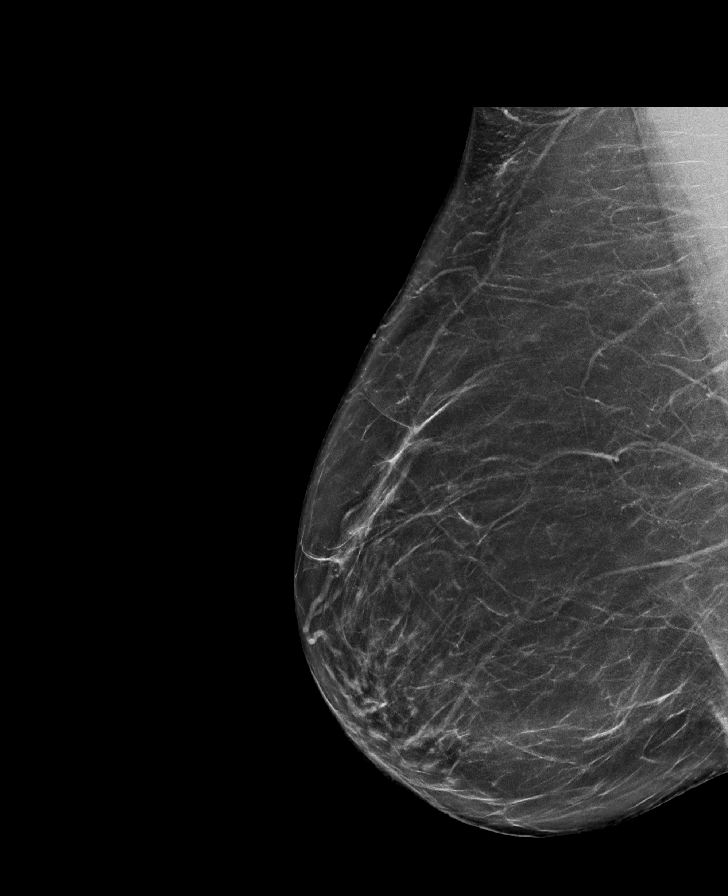

[L CC synth-2D]
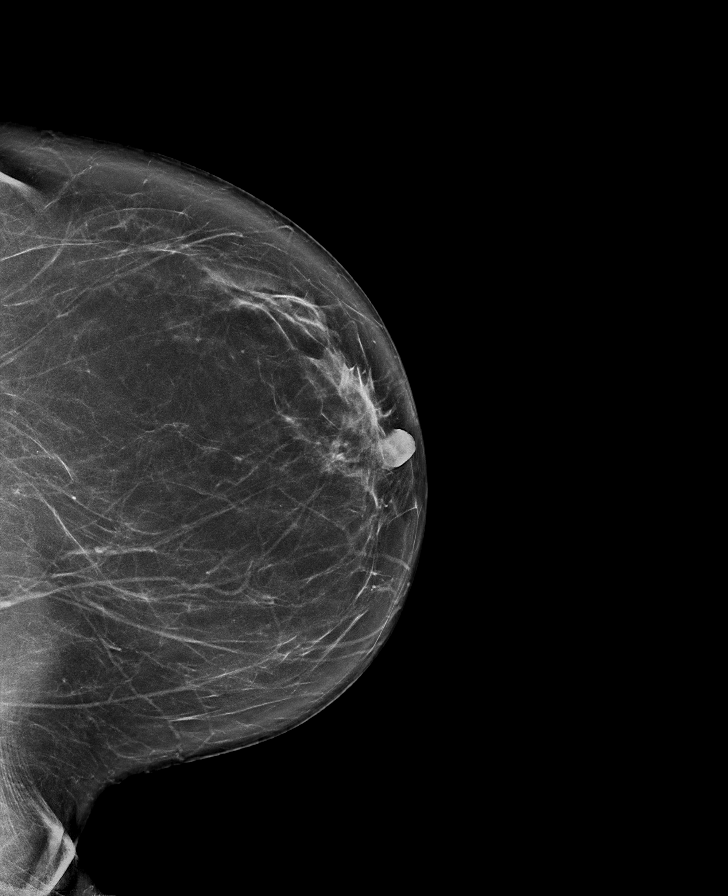

[L MLO tomo · tomo slice 36/71.0]
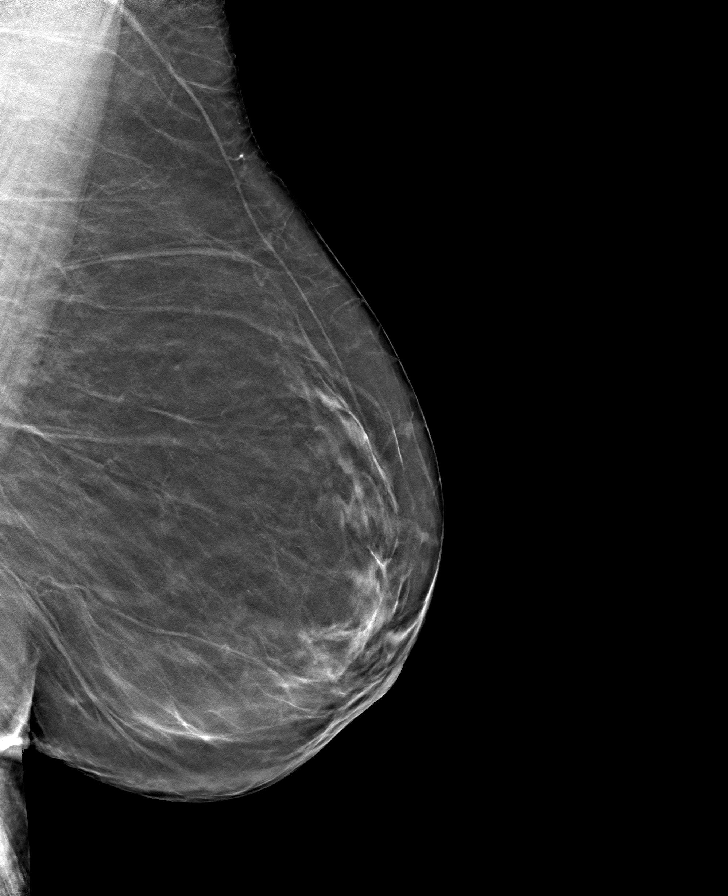

[L CC tomo · tomo slice 38/75.0]
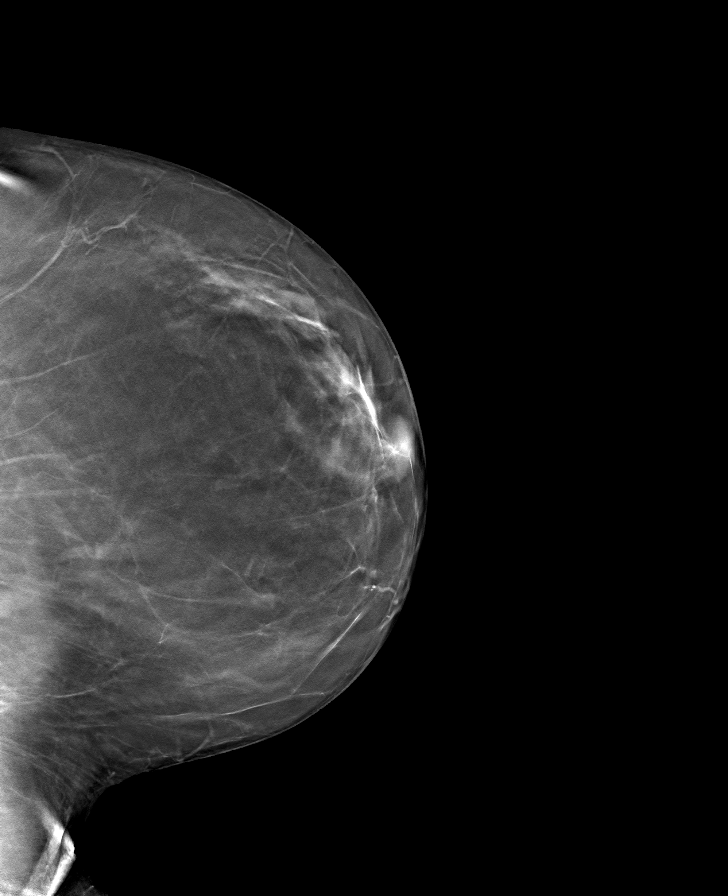

[R MLO tomo · tomo slice 40/79.0]
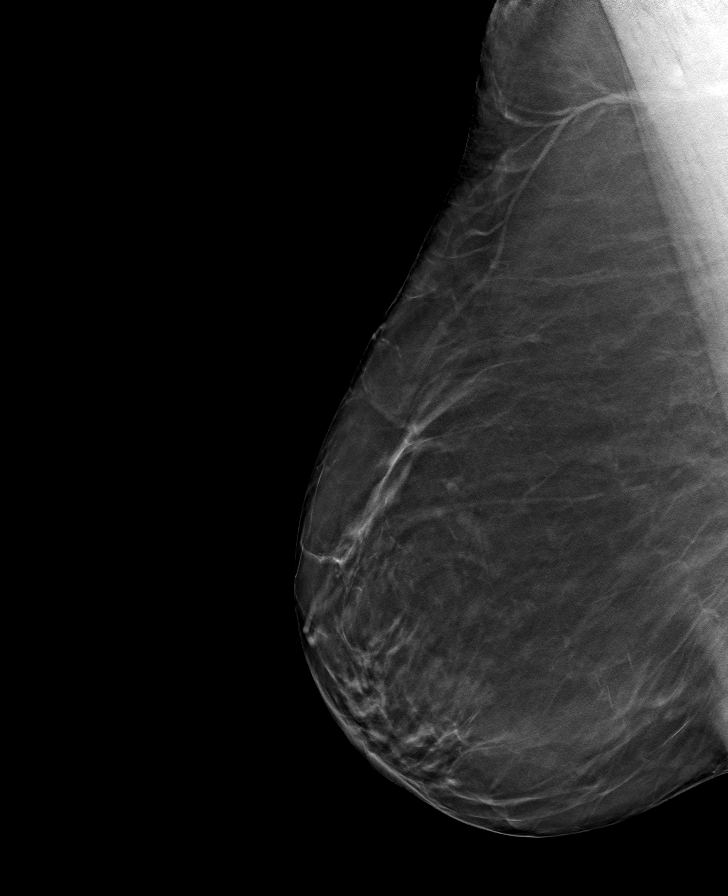

[R CC tomo · tomo slice 35/70.0]
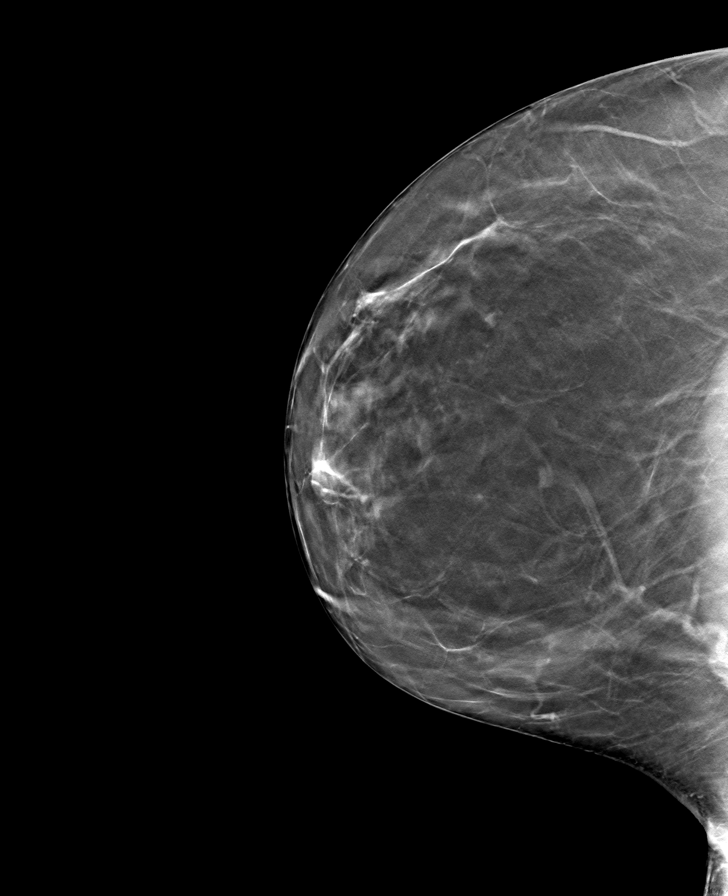

[8 of 24 positions shown; findings below may reference images not displayed]

ACR Breast Density Category b: There are scattered areas of
fibroglandular density.
FINDINGS: There are no findings suspicious for malignancy. Images were
processed with CAD.
IMPRESSION: No mammographic evidence of malignancy. A result letter of this
screening mammogram will be mailed directly to the patient.

RECOMMENDATION:
Screening mammogram in one year. (Code:Y5-G-EJ6)

BI-RADS CATEGORY  1: Negative.
